# Patient Record
Sex: Male | Born: 2000 | Race: White | Hispanic: No | Marital: Single | Smoking: Never smoker
Health system: Southern US, Community
[De-identification: ages and names within clinical notes are randomized; demographics above are authoritative.]

## PROBLEM LIST (undated history)

## (undated) DIAGNOSIS — Z789 Other specified health status: Secondary | ICD-10-CM

## (undated) DIAGNOSIS — G43D0 Abdominal migraine, not intractable: Secondary | ICD-10-CM

## (undated) HISTORY — PX: WISDOM TOOTH EXTRACTION: SHX21

---
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
## 2019-03-12 ENCOUNTER — Telehealth: Payer: Self-pay | Admitting: Family Medicine

## 2019-03-12 DIAGNOSIS — F32A Depression, unspecified: Secondary | ICD-10-CM

## 2019-03-12 DIAGNOSIS — F329 Major depressive disorder, single episode, unspecified: Secondary | ICD-10-CM

## 2019-03-12 NOTE — Telephone Encounter (Signed)
Referral put in. Left message to return call  °

## 2019-03-12 NOTE — Telephone Encounter (Signed)
That is fine to go ahead with referral as requested certainly if any emergent symptoms going on with depression we recommend virtual visit or being seen [facility]

## 2019-03-12 NOTE — Telephone Encounter (Signed)
Mom Richard Raymond) is requesting a referral to Dr.Umrania at [facility] for depression. Patient is wanting to see a male doctor.Dr Jerold Coombe -[phone]

## 2019-03-23 NOTE — Telephone Encounter (Signed)
Left message to return call 

## 2019-03-25 ENCOUNTER — Ambulatory Visit (INDEPENDENT_AMBULATORY_CARE_PROVIDER_SITE_OTHER): Payer: 59 | Admitting: Child and Adolescent Psychiatry

## 2019-03-25 ENCOUNTER — Encounter: Payer: Self-pay | Admitting: Child and Adolescent Psychiatry

## 2019-03-25 ENCOUNTER — Other Ambulatory Visit: Payer: Self-pay

## 2019-03-25 DIAGNOSIS — F418 Other specified anxiety disorders: Secondary | ICD-10-CM | POA: Diagnosis not present

## 2019-03-25 DIAGNOSIS — F422 Mixed obsessional thoughts and acts: Secondary | ICD-10-CM

## 2019-03-25 DIAGNOSIS — F332 Major depressive disorder, recurrent severe without psychotic features: Secondary | ICD-10-CM

## 2019-03-25 MED ORDER — HYDROXYZINE HCL 25 MG PO TABS: 25.0000 mg | ORAL_TABLET | Freq: Every evening | ORAL | 0 refills | Status: DC | PRN

## 2019-03-25 MED ORDER — FLUOXETINE HCL 20 MG PO TABS: 20.0000 mg | ORAL_TABLET | Freq: Every day | ORAL | 0 refills | Status: DC

## 2019-03-25 NOTE — Progress Notes (Signed)
Virtual Visit via Telephone Note(attempted to do video visit, but because of technological difficulties at pt's end could not establish connection via video)  I connected with Richard Raymond on 03/25/19 at  3:00 PM EDT by telephone and verified that I am speaking with the correct person using two identifiers.  Location: Patient: mother's home Provider: office   I discussed the limitations, risks, security and privacy concerns of performing an evaluation and management service by telephone and the availability of in person appointments. I also discussed with the patient that there may be a patient responsible charge related to this service. The patient expressed understanding and agreed to proceed  Psychiatric Initial Child/Adolescent Assessment   Patient Identification: Richard Raymond MRN:  [id number] Date of Evaluation:  03/25/2019 Referral Source: Lilyan Punt (PCP) Chief Complaint:  "because I have been depressed..." Chief Complaint    Establish Care     Visit Diagnosis:    ICD-10-CM   1. Severe episode of recurrent major depressive disorder, without psychotic features (HCC) F33.2 FLUoxetine (PROZAC) 20 MG tablet    CBC With Differential    Comprehensive metabolic panel    TSH    Vitamin B12    VITAMIN D 25 Hydroxy (Vit-D Deficiency, Fractures)  2. Other specified anxiety disorders F41.8 FLUoxetine (PROZAC) 20 MG tablet  3. Mixed obsessional thoughts and acts F42.2 FLUoxetine (PROZAC) 20 MG tablet    History of Present Illness::   This is an 18 year old Caucasian male with medical history significant of abdominal migraine, and no formal psychiatric history referred by PCP for psychiatric evaluation and medication management for depression and anxiety.  He is homeschooled since early age and currently "in between 11th and 12th grade", his parents are separated since last 1 year and he spends 50-50 time with each parent, and he has 2 younger brothers.  Writer attempted to contact  evaluation over telemedicine video encounter however due to technological issues at the patient's end could connect via video therefore spoke with patient over the phone to complete evaluation.  Reports he is being referred because he is depressed.   Depression - Describes depression as "I have had low energy for a while... Lost interest in things that usually I have interest in... I can't maintain steady sleep schedule regardless of what I do... and I had issues getting my stuff done on time like school work or other things...". Onset - Since past few years since age 68-13. Mostly consistent and not episodic. Worsened progressively over years, feeling more and more tired. Denied any acute precipitant to it but reports worsened since the puberty. Reports depression is associated with anhedonia, sleep disturbances(can lay in bed for 6-7 hours without getting sleep, unable to stop overthinking about things mostly worrying thoughts), difficulties with concentration, appetite disturbance("sometimes I eat less and sometimes I eat more, I do stress eat sometimes, but not to the point of feeling uncomfortable or throw up afterwards except once five years ago"), poor energy(sometimes I don't want to get up at all). Reports chronic passive SI for the past few years, occurring about once every 2 weeks, and lasting maximum by 20 minutes and usually for about three minutes. Describes these thoughts as "why bother continue living..". Reports that he does not act on these thoughts because he doesn't want hurt people around him and still has goals he wants to achieve eventually. Goals - 1. Want draw a comic strip (story based ongoing comic script). 2. Really want to make music specifically in the fusion of  genres, Make music for living. Denies violence. Reports depression is always as there, denies any modifying factors.     Anxiety - Describes it as "I tend to worry about what others think of me, I constantly worry that I am  doing something wrong, I always worry that I don't measure up, tends to hamper my ability to do things along with low energy" Onset - "Pretty sure some extent the whole life". Reports that It has gotten worse over time. Reports constant anxiety. Panic attacks - infrequent, last in January can last for 2 hours, describes them as having racing heart and shaking.  Reports severe social anxiety for a long time". Reports that he often thinks of worse case scenarios that bring the anxiety.      OCD - OCD - "Constantly checking things, I keep going to pantry, looking for something, even there is nothing I need." Reports that he has intrusive thoughts which he describes as "weird thoughts that go against me and gross me out". Elaborates them as unwanted sexual, violence and sexual violence thoughts. Reports that he hoards things which could be useful later, says his room is pretty cluttered. Reports that "if I see things with multiple denomination it bothers me. you might see 2472 on the page or 2146 that would irk me constantly unless I stop looking at it..."  Denies AVH, did not admit delusions, denies manic or hypomanic symptoms, denies eating disorder symptoms, denies substance abuse.  Collateral information from mother - Mother reports that her main concern for Richard Raymond is his ability to function. He has been depressed, isolative. She reports that this started when he was around 2-31 years of age and progressively worsened over the time. His struggled academically and socially since then. She reports that at present he spends most of the time in his room and comes out only to eat something. She reported that she and her current husband started having problems with their relationship since he was 50-13 and that has adversely impacted him and his brothers over these years and appears to have precipitated depression, and he started having more academic problems since then. She reported that she did not have concerns  for ASD but one of the family member who works with kids with ASD and disabled child recently informed her about that she thinks Richard Raymond has characteristics similar to ASD. M shares that Richard Raymond reached all his developmental milestones on time, did not have struggle with socializing with others, but reported having sensitivity to light and difficulties with transitions. She reported that Richard Raymond has gained about 50-60 lbs over the past 2 years. She reported that she and her husband are separated and getting divorced, but Richard Raymond has been staying with his father most of the time because she does not have a bigger house and all of Richard Raymond's things are at dad's house.   Associated Signs/Symptoms: Depression Symptoms:  depressed mood, anhedonia, insomnia, psychomotor agitation, fatigue, feelings of worthlessness/guilt, difficulty concentrating, anxiety, panic attacks, decreased appetite, (Hypo) Manic Symptoms:  Denies Anxiety Symptoms:  Excessive Worry, Panic Symptoms, Obsessive Compulsive Symptoms:   Checking,, Social Anxiety, Psychotic Symptoms:  Did not admit and not elicited during the interview PTSD Symptoms: NA  Past Psychiatric History:   Inpatient: None RTC: None Outpatient: None    - Meds: None    - Therapy: None Denies previous suicide attempts or violence.    Previous Psychotropic Medications: No   Substance Abuse History in the last 12 months:  No.  Consequences  of Substance Abuse: NA  Past Medical History: Pt reports that he has abdominal migraine and also migraine headaches. He reports that he has severe abdominal pain and it is hard for him to keep food down during these episodes. His mother reported that pt and his father both has these severe abdominal migraines. Reports that his father had to be admitted few times for this.  Past Medical History:  Diagnosis Date  . Abdominal migraine     Past Surgical History:  Procedure Laterality Date  . WISDOM TOOTH  EXTRACTION      Family Psychiatric History: Parents and Grandparents with Depression and Anxiety  Family History:  Family History  Problem Relation Age of Onset  . ADD / ADHD Brother     Social History:   Social History   Socioeconomic History  . Marital status: Single    Spouse name: Not on file  . Number of children: 0  . Years of education: Not on file  . Highest education level: 12th grade  Occupational History  . Not on file  Social Needs  . Financial resource strain: Not hard at all  . Food insecurity:    Worry: Never true    Inability: Never true  . Transportation needs:    Medical: Yes    Non-medical: Yes  Tobacco Use  . Smoking status: Never Smoker  . Smokeless tobacco: Never Used  Substance and Sexual Activity  . Alcohol use: No  . Drug use: No  . Sexual activity: Not Currently  Lifestyle  . Physical activity:    Days per week: 0 days    Minutes per session: 0 min  . Stress: Very much  Relationships  . Social connections:    Talks on phone: Not on file    Gets together: Not on file    Attends religious service: Never    Active member of club or organization: Yes    Attends meetings of clubs or organizations: 1 to 4 times per year    Relationship status: Never married  Other Topics Concern  . Not on file  Social History Narrative  . Not on file    Additional Social History:   Living and custody situation: Parents are separated about a year ago going to get divorced, Reports that environment is "better now just by a bit" because prior to separation there was lot of yelling, etc. Shares time with both mother and father. However living with father most of the time for the past two weeks.    Relationships: Father - Decent; Mother - Decent; Siblings - younger, [id number] and [id number] - get along well with one but not other  Friends: Several friends, most of them are online. In person making friends are difficult, it is difficult to make fiend.   Sexual ID:  Bisexual; Gender ID up for debate - 4 months. Prefers He pronoun. Caused me some stress.  Guns:  No access to guns by himself.     Developmental History mentioned in HPI.   School History: Always homeschooled, currently in between 11th and 12th grade Legal History: None Hobbies/Interests: Likes to make music, play piano, draw, play video and virtual table top games.   Allergies:   Allergies  Allergen Reactions  . Cefzil [Cefprozil]     Diarrhea    Metabolic Disorder Labs: No results found for: HGBA1C, MPG No results found for: PROLACTIN No results found for: CHOL, TRIG, HDL, CHOLHDL, VLDL, LDLCALC No results found for: TSH  Therapeutic Level  Labs: No results found for: LITHIUM No results found for: CBMZ No results found for: VALPROATE  Current Medications: Current Outpatient Medications  Medication Sig Dispense Refill  . FLUoxetine (PROZAC) 20 MG tablet Take 1 tablet (20 mg total) by mouth daily. 30 tablet 0   No current facility-administered medications for this visit.     Musculoskeletal: Strength & Muscle Tone: unable to assess since visit was over the telemedicine. Gait & Station: unable to assess since visit was over the telemedicine. Patient leans: N/A  Psychiatric Specialty Exam: ROSReview of 12 systems negative except as mentioned in HPI   There were no vitals taken for this visit.There is no height or weight on file to calculate BMI.  General Appearance: unable to assess since it was a telephone encounter  Eye Contact:  unable to assess since it was a telephone encounter  Speech:  Clear and Coherent, Slow and Monotonous  Volume:  Normal  Mood:  Depressed  Affect:  unable to assess since it was a telephone encounter  Thought Process:  Goal Directed and Linear  Orientation:  Full (Time, Place, and Person)  Thought Content:  Concrete, No delusions or AVH  Suicidal Thoughts:  No  Homicidal Thoughts:  No  Memory:  Immediate;   Fair Recent;   Fair Remote;    Fair  Judgement:  Fair  Insight:  Fair  Psychomotor Activity:  unable to assess since it was a telephone encounter  Concentration: Concentration: Fair and Attention Span: Fair  Recall:  [employer] of Knowledge: Fair  Language: Fair  Akathisia:  NA  Handed:  NA  AIMS (if indicated):  not done  Assets:  Manufacturing systems engineer Desire for Improvement Financial Resources/Insurance Housing Leisure Time Physical Health Social Support Transportation Vocational/Educational  ADL's:  Intact  Cognition: WNL  Sleep:  Poor   Screenings:   Assessment and Plan:   # Depression (worse) - His reports are most consistent with Major depressive disorder. He is also biologically predisposed.  - Start Prozac 20 mg daily - Side effects including but not limited to nausea, vomiting, diarrhea, constipation, headaches, dizziness, black box warning of suicidal thoughts with SSRI were discussed with pt. Pt provided informed consent.  - Referred for ind therapy at [facility].  - Ordered CBC, CMP, TSH, Vit D and B12 levels.   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  - Same as mentioned above.   # OCD (worse) - His reports are most consistent with OCD.  - Same as mentioned above.   # Asperger's syndrome (chronic) - Mother's report of some sensory issues, difficulties with transition when he was young, concreteness, concerning for Asperger's syndrome. Will continue to evaluate.  Collateral information were obtained from mother Treatment plan was discussed with his mother with pt's consent.   Follow Up Instructions:    I discussed the assessment and treatment plan with the patient. The patient was provided an opportunity to ask questions and all were answered. The patient agreed with the plan and demonstrated an understanding of the instructions.   The patient was advised to call back or seek an in-person evaluation if the  symptoms worsen or if the condition fails to improve as anticipated.  I provided 60 minutes of non-face-to-face time during this encounter.    [name], MD 4/30/20205:53 PM

## 2019-03-25 NOTE — Progress Notes (Signed)
Tc on  03-25-19 @ 2:07 pt medical and surgical hx was reviewed and updated. Pt mediations and pharmacy was reviewed and updated. Allergies reviewed with no changes. No vitals taken as this was a phone consult.

## 2019-03-25 NOTE — Patient Instructions (Signed)
-   Please start Prozac (Fluoxetine) 20 mg daily - Please Complete the blood work. Blood work requisition order is accompanied with this.

## 2019-03-25 NOTE — Progress Notes (Signed)
Richard Raymond is a 18 y.o. male in treatment for Anxiety, Depression and OCD and displays the following risk factors for Suicide:  Demographic factors:  Male, Adolescent or young adult and Caucasian Current Mental Status: Deneis SI/HI Loss Factors: Decrease in vocational status Historical Factors: Family history of mental illness or substance abuse Risk Reduction Factors: Living with another person, especially a relative and Positive social support  CLINICAL FACTORS:  Severe Anxiety and/or Agitation Depression:   Anhedonia Obsessive-Compulsive Disorder More than one psychiatric diagnosis  COGNITIVE FEATURES THAT CONTRIBUTE TO RISK: Polarized thinking Thought constriction (tunnel vision)    SUICIDE RISK:  Richard Raymond currently denies any SI/HI and does not appear in imminent danger to self/others. His hx of depression, anxiety, OCD,  Intermittent chronic passive suicidal thoughts appears to put him at a chronically elevated risk of self harm. He appears future oriented, has long term goals for himself, appear to have good social support, appear to have financial stability and these all will likely serve as protective factors for him. He and parent are recommended to follow up with outpatient providers for medications, and therapy which would likely help reduce chronic risk.   Mental Status: As mentioned in H&P from today's visit.    PLAN OF CARE: As mentioned in H&P from today's visit.    [name], MD 03/25/2019, 6:46 PM

## 2019-03-26 ENCOUNTER — Encounter: Payer: Self-pay | Admitting: Child and Adolescent Psychiatry

## 2019-03-29 ENCOUNTER — Telehealth: Payer: Self-pay | Admitting: Family Medicine

## 2019-03-29 NOTE — Telephone Encounter (Signed)
Patient was seen psychiatry in [place]  They were concerned about the possibility of porphyria  Patient has recurring abdominal pains They are requesting referral to specialist Under the referral please put concern for possibility of porphyria  I recommend [facility] Dr. Leighton Parody Thanks

## 2019-03-29 NOTE — Telephone Encounter (Signed)
Referral put in.

## 2019-03-29 NOTE — Addendum Note (Signed)
Addended by: Metro Kung on: 03/29/2019 01:37 PM   Modules accepted: Orders

## 2019-03-30 ENCOUNTER — Telehealth: Payer: Self-pay | Admitting: Child and Adolescent Psychiatry

## 2019-03-30 NOTE — Telephone Encounter (Signed)
I spoke with Richard Raymond and his mother separately today. Spoke with Mother after Richard Raymond's permission. I discussed with them about the concerns for acute intermittent porphyria and the reason behind it(both Richard Raymond and his father has intermittent abdominal pain with diarrhea and constipation and Richard Raymond has significant psychiatric issues). Discussed with them AIP is rare and presents with vague neurovisceral signs and symptom and requires high degree of suspicion. Discussed importance to rule this out. Both Richard Raymond and Mother verbalized understanding. Writer spoke with Dr. Gerda Diss yesterday expressing the concerns and he has made a referral to specialist. Writer informed this to [facility] and mother.

## 2019-04-08 ENCOUNTER — Other Ambulatory Visit: Payer: Self-pay

## 2019-04-08 ENCOUNTER — Encounter: Payer: Self-pay | Admitting: Child and Adolescent Psychiatry

## 2019-04-08 ENCOUNTER — Ambulatory Visit (INDEPENDENT_AMBULATORY_CARE_PROVIDER_SITE_OTHER): Payer: 59 | Admitting: Child and Adolescent Psychiatry

## 2019-04-08 DIAGNOSIS — F332 Major depressive disorder, recurrent severe without psychotic features: Secondary | ICD-10-CM

## 2019-04-08 DIAGNOSIS — F418 Other specified anxiety disorders: Secondary | ICD-10-CM | POA: Diagnosis not present

## 2019-04-08 DIAGNOSIS — F422 Mixed obsessional thoughts and acts: Secondary | ICD-10-CM | POA: Diagnosis not present

## 2019-04-08 MED ORDER — FLUOXETINE HCL 20 MG PO TABS: 20.0000 mg | ORAL_TABLET | Freq: Every day | ORAL | 0 refills | Status: DC

## 2019-04-08 MED ORDER — FLUOXETINE HCL 10 MG PO TABS: 10.0000 mg | ORAL_TABLET | Freq: Every day | ORAL | 0 refills | Status: DC

## 2019-04-08 NOTE — Progress Notes (Signed)
Virtual Visit via Video Note  I connected with Richard Raymond on 04/08/19 at  3:00 PM EDT by a video enabled telemedicine application and verified that I am speaking with the correct person using two identifiers.  Location: Patient: Home Provider: Office   I discussed the limitations of evaluation and management by telemedicine and the availability of in person appointments. The patient expressed understanding and agreed to proceed.   BH MD/PA/NP OP Progress Note  04/08/2019 3:36 PM Richard Raymond  MRN:  [id number]  Chief Complaint: Med management follow up for Depression, anxiety and OCD.   HPI: 18 yo with hx of depression, anxiety and OCD who was seen for initial evaluation about 2 weeks ago and was started on Prozac 20 mg once a day presents today for medication management follow-up for depression, anxiety and OCD.  He reports that since he started on Prozac he feels he has more energy and motivation, feeling less depressed and has been sleeping slightly better.  He reports that his sleep continues to be out of routine but instead of going to bed at 4 oh 5 in the morning he is able to go to bed at 2:00 in the morning.  He reports that his anxiety is slightly better but not as much as compared to his mood and energy.  He however reports that his anxiety is mostly in the context of social situation and he has been staying at home mostly.  He denies any change in his obsessive thoughts.  He reports that about 1-1/2 weeks ago he had passive suicidal thoughts because he felt in his game "dragons and dungeons".  Denies any other suicidal thoughts or self-harm behaviors.  He reports that he has noted some mild easiness into his stomach when he started Prozac which is getting better overall.  Denies any other concerns for today's visit. Visit Diagnosis:    ICD-10-CM   1. Severe episode of recurrent major depressive disorder, without psychotic features (HCC) F33.2 FLUoxetine (PROZAC) 20 MG tablet  2. Other  specified anxiety disorders F41.8 FLUoxetine (PROZAC) 20 MG tablet    FLUoxetine (PROZAC) 10 MG tablet  3. Mixed obsessional thoughts and acts F42.2 FLUoxetine (PROZAC) 20 MG tablet    Past Psychiatric History: As mentioned in initial H&P, reviewed today, no change  Past Medical History:  Past Medical History:  Diagnosis Date  . Abdominal migraine     Past Surgical History:  Procedure Laterality Date  . WISDOM TOOTH EXTRACTION      Family Psychiatric History: As mentioned in initial H&P, reviewed today, no change  Family History:  Family History  Problem Relation Age of Onset  . ADD / ADHD Brother     Social History:  Social History   Socioeconomic History  . Marital status: Single    Spouse name: Not on file  . Number of children: 0  . Years of education: Not on file  . Highest education level: 12th grade  Occupational History  . Not on file  Social Needs  . Financial resource strain: Not hard at all  . Food insecurity:    Worry: Never true    Inability: Never true  . Transportation needs:    Medical: Yes    Non-medical: Yes  Tobacco Use  . Smoking status: Never Smoker  . Smokeless tobacco: Never Used  Substance and Sexual Activity  . Alcohol use: No  . Drug use: No  . Sexual activity: Not Currently  Lifestyle  . Physical activity:  Days per week: 0 days    Minutes per session: 0 min  . Stress: Very much  Relationships  . Social connections:    Talks on phone: Not on file    Gets together: Not on file    Attends religious service: Never    Active member of club or organization: Yes    Attends meetings of clubs or organizations: 1 to 4 times per year    Relationship status: Never married  Other Topics Concern  . Not on file  Social History Narrative  . Not on file    Allergies:  Allergies  Allergen Reactions  . Cefzil [Cefprozil]     Diarrhea    Metabolic Disorder Labs: No results found for: HGBA1C, MPG No results found for:  PROLACTIN No results found for: CHOL, TRIG, HDL, CHOLHDL, VLDL, LDLCALC No results found for: TSH  Therapeutic Level Labs: No results found for: LITHIUM No results found for: VALPROATE No components found for:  CBMZ  Current Medications: Current Outpatient Medications  Medication Sig Dispense Refill  . FLUoxetine (PROZAC) 10 MG tablet Take 1 tablet (10 mg total) by mouth daily. To be combined with Prozac 20 mg daily. 30 tablet 0  . FLUoxetine (PROZAC) 20 MG tablet Take 1 tablet (20 mg total) by mouth daily. 30 tablet 0  . hydrOXYzine (ATARAX/VISTARIL) 25 MG tablet Take 1 tablet (25 mg total) by mouth at bedtime as needed (Sleep). 30 tablet 0   No current facility-administered medications for this visit.      Musculoskeletal: Strength & Muscle Tone: unable to assess since visit was over the telemedicine. Gait & Station: unable to assess since visit was over the telemedicine. Patient leans: N/A  Psychiatric Specialty Exam: ROS  There were no vitals taken for this visit.There is no height or weight on file to calculate BMI.  General Appearance: Casual and Fairly Groomed  Eye Contact:  Good  Speech:  Slow  Volume:  Normal  Mood:  "better"  Affect:  Appropriate and Constricted  Thought Process:  Linear and Descriptions of Associations: Intact evidence of thought blocking  Orientation:  Full (Time, Place, and Person)  Thought Content: Logical   Suicidal Thoughts:  No  Homicidal Thoughts:  No  Memory:  Immediate;   Fair Recent;   Fair Remote;   Fair  Judgement:  Fair  Insight:  Fair  Psychomotor Activity:  Normal  Concentration:  Concentration: Fair and Attention Span: Fair  Recall:  [employer] of Knowledge: Fair  Language: Fair  Akathisia:  NA    AIMS (if indicated): not done  Assets:  Communication Skills Desire for Improvement Housing Leisure Time Physical Health Social Support Transportation Vocational/Educational  ADL's:  Intact  Cognition: WNL  Sleep:   Good   Screenings:   Assessment and Plan:   # Depression (improving) - His reports are most consistent with Major depressive disorder. He is also biologically predisposed.  - Increase Prozac to 30 mg daily - Side effects including but not limited to nausea, vomiting, diarrhea, constipation, headaches, dizziness, black box warning of suicidal thoughts with SSRI were discussed with pt. Pt provided informed consent.  - Therapy scheduled with Ms. Peacock next week.  - Ordered CBC, CMP, TSH, Vit D and B12 levels. Pending.   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  - Same as mentioned above.   # OCD (worse) - His reports are most consistent with OCD.  -  Same as mentioned above.   # Asperger's syndrome (chronic) - Mother's report of some sensory issues, difficulties with transition when he was young, concreteness, concerning for Asperger's syndrome.   # Acute Intermittent porphyria(AIP) - Concerns for AIP. He has hx of abdominal migraine which he describes as intermittent episodes of uneasiness in stomach and moderate to severe abdominal pain accompanied with mild numbness in his body, bloating. His father has similar hx and perhaps worse attacks than him. Spoke with porphyria foundation representative on their physician consultation line and they recommended free genetic testing. Conveying this to PCP. Also spoke with PCP about the concern previous and he has referred pt to GI. Discussed this with pt and father with pt's permission.   Mother phone  - [phone]  30 minutes with pt > 50% time spent on counseling and coordination of care.     Follow Up Instructions:    I discussed the assessment and treatment plan with the patient. The patient was provided an opportunity to ask questions and all were answered. The patient agreed with the plan and demonstrated an understanding of the  instructions.   The patient was advised to call back or seek an in-person evaluation if the symptoms worsen or if the condition fails to improve as anticipated.  I provided 30 minutes of non-face-to-face time during this encounter.   [name], MD  [name], MD 04/08/2019, 3:36 PM

## 2019-04-15 ENCOUNTER — Ambulatory Visit (INDEPENDENT_AMBULATORY_CARE_PROVIDER_SITE_OTHER): Payer: 59 | Admitting: Licensed Clinical Social Worker

## 2019-04-15 ENCOUNTER — Other Ambulatory Visit: Payer: Self-pay

## 2019-04-15 DIAGNOSIS — F418 Other specified anxiety disorders: Secondary | ICD-10-CM | POA: Diagnosis not present

## 2019-04-15 DIAGNOSIS — F332 Major depressive disorder, recurrent severe without psychotic features: Secondary | ICD-10-CM

## 2019-04-18 ENCOUNTER — Other Ambulatory Visit: Payer: Self-pay | Admitting: Child and Adolescent Psychiatry

## 2019-04-18 DIAGNOSIS — F418 Other specified anxiety disorders: Secondary | ICD-10-CM

## 2019-04-21 NOTE — Progress Notes (Signed)
[facility] Initial Clinical Assessment  MRN: [id number] NAME: Wylan Himebaugh Date: 04/15/19  Total time: 1 hour  Type of Contact:  virtual Patient consent obtained:  yes Reason for Visit today:  establish care  Treatment History Patient recently received Inpatient Treatment:  denies  Facility/Program:    Date of discharge:   Patient currently being seen by therapist/psychiatrist:  Dr. Jerold Coombe Patient currently receiving the following services:    Past Psychiatric History/Hospitalization(s): Anxiety: Yes Bipolar Disorder: No Depression: Yes Mania: No Psychosis: No Schizophrenia: No Personality Disorder: No Hospitalization for psychiatric illness: No History of Electroconvulsive Shock Therapy: No Prior Suicide Attempts: No  Clinical Assessment:  PHQ-9 Assessments: No flowsheet data found.  GAD-7 Assessments: No flowsheet data found.   Social Functioning Social maturity:   Social judgement:    Stress Current stressors:   Familial stressors:   Sleep:   Appetite:   Coping ability:   Patient taking medications as prescribed:    Current medications:  Outpatient Encounter Medications as of 04/15/2019  Medication Sig  . FLUoxetine (PROZAC) 10 MG tablet Take 1 tablet (10 mg total) by mouth daily. To be combined with Prozac 20 mg daily.  Marland Kitchen FLUoxetine (PROZAC) 20 MG tablet Take 1 tablet (20 mg total) by mouth daily.  . [DISCONTINUED] hydrOXYzine (ATARAX/VISTARIL) 25 MG tablet Take 1 tablet (25 mg total) by mouth at bedtime as needed (Sleep).   No facility-administered encounter medications on file as of 04/15/2019.     Self-harm Behaviors Risk Assessment Self-harm risk factors:   Patient endorses recent thoughts of harming self:    [place] Suicide Severity Rating Scale: No flowsheet data found.  Danger to Others Risk Assessment Danger to others risk factors:   Patient endorses recent thoughts of harming others:    Dynamic Appraisal of Situational  Aggression (DASA): No flowsheet data found.  Substance Use Assessment Patient recently consumed alcohol:    Alcohol Use Disorder Identification Test (AUDIT): No flowsheet data found. Patient recently used drugs:    Opioid Risk Assessment:  Patient is concerned about dependence or abuse of substances:    ASAM Multidimensional Assessment Summary:  Dimension 1:    Dimension 1 Rating:    Dimension 2:    Dimension 2 Rating:    Dimension 3:    Dimension 3 Rating:    Dimension 4:    Dimension 4 Rating:    Dimension 5:    Dimension 5 Rating:    Dimension 6:    Dimension 6 Rating:   ASAM's Severity Rating Score:   ASAM Recommended Level of Treatment:     Goals, Interventions and Follow-up Plan Goals: Increase healthy adjustment to current life circumstances Interventions: Motivational Interviewing, Behavioral Activation and Brief CBT Follow-up Plan: Refer to [facility] Outpatient Therapy  Summary of Clinical Assessment Summary: Jb is a 18 year old, single male.  He lives with his father in [place], [place]. He expresses depressive symptoms (sadness, lack of motivation, increased appetite, fatigue, poor relationships with peers) on a daily basis for the past few years. He has a good relationship with his father.  He reprots that his strength is his ability to focus and he acknowledges a weakness of social interaction.  He has been home schooled since [place] and is not working.  He reports only having friends online.  He enjoys gaming, playing the piano, drawing and being apart of an online dungeons and dragons group. Rollie reports a family history of depression.     Marinda Elk, LCSW

## 2019-04-23 ENCOUNTER — Telehealth: Payer: Self-pay | Admitting: Family Medicine

## 2019-04-23 NOTE — Telephone Encounter (Signed)
Please advise - Dr. Joellyn Rued is at [facility] we refer to him specifically or should we refer to [facility]?

## 2019-04-26 NOTE — Telephone Encounter (Signed)
This specialist was recommended by his psychiatrist because this doctor specializes with porphyria If the family wants to go to [facility] it would be fine to refer them in that direction their choice

## 2019-05-04 ENCOUNTER — Encounter: Payer: Self-pay | Admitting: Family Medicine

## 2019-05-05 ENCOUNTER — Other Ambulatory Visit: Payer: Self-pay | Admitting: Child and Adolescent Psychiatry

## 2019-05-05 DIAGNOSIS — F418 Other specified anxiety disorders: Secondary | ICD-10-CM

## 2019-05-05 NOTE — Telephone Encounter (Signed)
Faxed referral to [facility] 05/04/2019 & mailed letter to pt explaining that referral had been sent

## 2019-05-06 ENCOUNTER — Ambulatory Visit (INDEPENDENT_AMBULATORY_CARE_PROVIDER_SITE_OTHER): Payer: 59 | Admitting: Child and Adolescent Psychiatry

## 2019-05-06 ENCOUNTER — Encounter: Payer: Self-pay | Admitting: Child and Adolescent Psychiatry

## 2019-05-06 ENCOUNTER — Other Ambulatory Visit: Payer: Self-pay

## 2019-05-06 DIAGNOSIS — F331 Major depressive disorder, recurrent, moderate: Secondary | ICD-10-CM | POA: Diagnosis not present

## 2019-05-06 DIAGNOSIS — F422 Mixed obsessional thoughts and acts: Secondary | ICD-10-CM

## 2019-05-06 DIAGNOSIS — F418 Other specified anxiety disorders: Secondary | ICD-10-CM | POA: Insufficient documentation

## 2019-05-06 MED ORDER — FLUOXETINE HCL 20 MG PO TABS: 20.0000 mg | ORAL_TABLET | Freq: Every day | ORAL | 0 refills | Status: DC

## 2019-05-06 NOTE — Progress Notes (Signed)
Virtual Visit via Video Note  I connected with [name] on 05/06/19 at  2:00 PM EDT by a video enabled telemedicine application and verified that I am speaking with the correct person using two identifiers.  Location: Patient: Home Provider: Office   I discussed the limitations of evaluation and management by telemedicine and the availability of in person appointments. The patient expressed understanding and agreed to proceed.   BH MD/PA/NP OP Progress Note  05/06/2019 2:48 PM Richard Raymond  MRN:  [id number]  Chief Complaint: Medication management follow-up for depression, anxiety and OCD. HPI: This is an 18 year old who is Caucasian male with history of depression, anxiety and OCD was seen and evaluated over telemedicine encounter for routine medication management follow-up visit.  He reports that since last visit he has noted improvement in his mood and anxiety symptoms.  He reports that he has been feeling overall better.  He reports that being able to get into the positive habits such as starting to exercise, communicating with others about the signs of him feeling better.  He rates his depression at 1-3/10(10 = most depressed) and anxiety at 2-4(10 = most anxious). He reports that he has been able to he gets panic attacks about every other week but they last about 30-60 minutes as compare to hours previously. He denies thoughts of suicide or self harm, reports eating well, improvement in anhedonia, and obsessive thoughts and compulsive behaviors. He reports having intrussive thoughts of violence but no sexual thoughts. He reported that he has noted he was sleeping too much and feeling dizzy through out the day with atarax 25 so he stopped taking it so his sleep is still a problem. He reported that he wanted to talk about something privately and therefore video visit was switched to phone visit. He reported that he has thought about his gender identity recently and now believes he always his  gender. He reports strong dislike of his male appearance and male sexual organs. We discussed to speak more on this given limited time for this appointment and rescheduled a follow up a next week. He verbalized understanding.   Visit Diagnosis:    ICD-10-CM   1. Other specified anxiety disorders  F41.8 FLUoxetine (PROZAC) 20 MG tablet  2. Moderate episode of recurrent major depressive disorder (HCC)  F33.1 FLUoxetine (PROZAC) 20 MG tablet  3. Mixed obsessional thoughts and acts  F42.2 FLUoxetine (PROZAC) 20 MG tablet    Past Psychiatric History: As mentioned in initial H&P, reviewed today, no change  Past Medical History:  Past Medical History:  Diagnosis Date  . Abdominal migraine     Past Surgical History:  Procedure Laterality Date  . WISDOM TOOTH EXTRACTION      Family Psychiatric History: As mentioned in initial H&P, reviewed today, no change  Family History:  Family History  Problem Relation Age of Onset  . ADD / ADHD Brother     Social History:  Social History   Socioeconomic History  . Marital status: Single    Spouse name: Not on file  . Number of children: 0  . Years of education: Not on file  . Highest education level: 12th grade  Occupational History  . Not on file  Social Needs  . Financial resource strain: Not hard at all  . Food insecurity    Worry: Never true    Inability: Never true  . Transportation needs    Medical: Yes    Non-medical: Yes  Tobacco Use  . Smoking  status: Never Smoker  . Smokeless tobacco: Never Used  Substance and Sexual Activity  . Alcohol use: No  . Drug use: No  . Sexual activity: Not Currently  Lifestyle  . Physical activity    Days per week: 0 days    Minutes per session: 0 min  . Stress: Very much  Relationships  . Social Musicianconnections    Talks on phone: Not on file    Gets together: Not on file    Attends religious service: Never    Active member of club or organization: Yes    Attends meetings of clubs or  organizations: 1 to 4 times per year    Relationship status: Never married  Other Topics Concern  . Not on file  Social History Narrative  . Not on file    Allergies:  Allergies  Allergen Reactions  . Cefzil [Cefprozil]     Diarrhea    Metabolic Disorder Labs: No results found for: HGBA1C, MPG No results found for: PROLACTIN No results found for: CHOL, TRIG, HDL, CHOLHDL, VLDL, LDLCALC No results found for: TSH  Therapeutic Level Labs: No results found for: LITHIUM No results found for: VALPROATE No components found for:  CBMZ  Current Medications: Current Outpatient Medications  Medication Sig Dispense Refill  . FLUoxetine (PROZAC) 10 MG tablet TAKE 1 TABLET BY MOUTH DAILY TO BE COMBINED WITH PROZAC 20 MG DAILY. 30 tablet 0  . FLUoxetine (PROZAC) 20 MG tablet Take 1 tablet (20 mg total) by mouth daily. 30 tablet 0  . hydrOXYzine (ATARAX/VISTARIL) 25 MG tablet TAKE 1 TABLET BY MOUTH AT BEDTIME AS NEEDED FOR SLEEP 30 tablet 0   No current facility-administered medications for this visit.      Musculoskeletal: Strength & Muscle Tone: unable to assess since visit was over the telemedicine. Gait & Station: unable to assess since visit was over the telemedicine. Patient leans: N/A  Psychiatric Specialty Exam: ROS  There were no vitals taken for this visit.There is no height or weight on file to calculate BMI.  General Appearance: Casual and Fairly Groomed  Eye Contact:  Good  Speech:  Slow  Volume:  Normal  Mood:  "better"  Affect:  Appropriate and Constricted  Thought Process:  Linear and Descriptions of Associations: Intact evidence of thought blocking  Orientation:  Full (Time, Place, and Person)  Thought Content: Logical   Suicidal Thoughts:  No  Homicidal Thoughts:  No  Memory:  Immediate;   Fair Recent;   Fair Remote;   Fair  Judgement:  Fair  Insight:  Fair  Psychomotor Activity:  Normal  Concentration:  Concentration: Fair and Attention Span: Fair   Recall:  [employer]  Fund of Knowledge: Fair  Language: Fair  Akathisia:  NA    AIMS (if indicated): not done  Assets:  Communication Skills Desire for Improvement Housing Leisure Time Physical Health Social Support Transportation Vocational/Educational  ADL's:  Intact  Cognition: WNL  Sleep:  Good   Screenings:   Assessment and Plan:   # Depression (improving) - Continue Prozac 30 mg daily - Side effects including but not limited to nausea, vomiting, diarrhea, constipation, headaches, dizziness, black box warning of suicidal thoughts with SSRI were discussed with pt. Pt provided informed consent.  - Therapy scheduled with Ms. Peacock next week.  - Ordered CBC, CMP, TSH, Vit D and B12 levels. Pending.   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  -  Same as mentioned above.   # OCD (worse) - His reports are most consistent with OCD.  - Same as mentioned above.   # Asperger's syndrome (chronic) - Mother's report of some sensory issues, difficulties with transition when he was young, concreteness, concerning for Asperger's syndrome.   # Acute Intermittent porphyria(AIP) - Concerns for AIP. He has hx of abdominal migraine which he describes as intermittent episodes of uneasiness in stomach and moderate to severe abdominal pain accompanied with mild numbness in his body, bloating. His father has similar hx and perhaps worse attacks than him. Spoke with porphyria foundation representative previously on their physician consultation line and they recommended free genetic testing. Will follow up with PCP on this. Also spoke with PCP about the concern previous and he is referred to [facility].  # Gender Identity Concerns - Will continue to assess and recommend appropriate intervention.   Mother phone  - [phone]  30 minutes with pt > 50% time spent on counseling and coordination of care.      Follow Up Instructions:    I discussed the assessment and treatment plan with the patient. The patient was provided an opportunity to ask questions and all were answered. The patient agreed with the plan and demonstrated an understanding of the instructions.   The patient was advised to call back or seek an in-person evaluation if the symptoms worsen or if the condition fails to improve as anticipated.  I provided 30 minutes of non-face-to-face time during this encounter.   Darcel SmallingHiren M Lenka Zhao, MD  Darcel SmallingHiren M Folashade Gamboa, MD 05/06/2019, 2:48 PM

## 2019-05-13 ENCOUNTER — Encounter: Payer: Self-pay | Admitting: Child and Adolescent Psychiatry

## 2019-05-13 ENCOUNTER — Other Ambulatory Visit: Payer: Self-pay

## 2019-05-13 ENCOUNTER — Ambulatory Visit (INDEPENDENT_AMBULATORY_CARE_PROVIDER_SITE_OTHER): Payer: 59 | Admitting: Child and Adolescent Psychiatry

## 2019-05-13 DIAGNOSIS — F330 Major depressive disorder, recurrent, mild: Secondary | ICD-10-CM

## 2019-05-13 DIAGNOSIS — F418 Other specified anxiety disorders: Secondary | ICD-10-CM

## 2019-05-13 DIAGNOSIS — F422 Mixed obsessional thoughts and acts: Secondary | ICD-10-CM

## 2019-05-13 MED ORDER — FLUOXETINE HCL 10 MG PO TABS: ORAL_TABLET | ORAL | 0 refills | Status: DC

## 2019-05-13 MED ORDER — HYDROXYZINE HCL 25 MG PO TABS: 25.0000 mg | ORAL_TABLET | Freq: Every evening | ORAL | 0 refills | Status: DC | PRN

## 2019-05-13 MED ORDER — FLUOXETINE HCL 20 MG PO TABS: 20.0000 mg | ORAL_TABLET | Freq: Every day | ORAL | 0 refills | Status: DC

## 2019-05-13 NOTE — Progress Notes (Signed)
Virtual Visit via Video Note  I connected with Richard Raymond on 05/13/19 at  2:30 PM EDT by a video enabled telemedicine application and verified that I am speaking with the correct person using two identifiers.  Location: Patient: Home Provider: Office   I discussed the limitations of evaluation and management by telemedicine and the availability of in person appointments. The patient expressed understanding and agreed to proceed.   BH MD/PA/NP OP Progress Note  05/13/2019 5:19 PM Richard Raymond  MRN:  [id number]  Chief Complaint: Medication management follow-up for depression, anxiety and OCD.     HPI: This is an 18 year old Caucasian male, currently identifying transgeder male, preferring she/her pronoun at this time, with history of depression, anxiety and OCD was seen and evaluated over telemedicine encounter to discuss his concerns regarding her gender identity.  Patient was seen and evaluated for medication management visit last week during which she expressed concerns about her gender identity and therefore was rescheduled to see this Clinical research associatewriter today.  In the interim since last visit she reports that her mood has stayed mostly stable however because of headaches since the last 1 to 2 days she has been feeling little more down as compared to before, reports that her anxiety is stable, reports that she continues to sleep well and her energy level is good.  She denies thoughts of suicide or self-harm.  In regards of gender identity she reprots that she has had concerns for gender identity for sometimes, reports that since puberty she did not like her male sexual organ, over the past few years does not like her male body appearance, does not like when her grandparents or parents say her that she is "handsome.." and therfore would pick her skin on the face and arms, reports that he does not like using the pronoun to describe her male. She reports that looking herself in the mirror makes her  "cringe". She reports that she is not sure if her gender identity concern has contributed to her depression. We discussed her preference of how to move forward. She reports that for now she would like use she/her pronoun, does not want to disclose to anyone about her gender identity right now and we discussed that Clinical research associatewriter can facilitate the conversation with her parents if she would like. She verbalized understanding. She reports that she would prefer to be called Richard Raymond at this time.    Visit Diagnosis:    ICD-10-CM   1. Mild episode of recurrent major depressive disorder (HCC)  F33.0 FLUoxetine (PROZAC) 20 MG tablet  2. Other specified anxiety disorders  F41.8 FLUoxetine (PROZAC) 20 MG tablet    FLUoxetine (PROZAC) 10 MG tablet    hydrOXYzine (ATARAX/VISTARIL) 25 MG tablet  3. Mixed obsessional thoughts and acts  F42.2 FLUoxetine (PROZAC) 20 MG tablet    Past Psychiatric History: As mentioned in initial H&P, reviewed today, no change   Past Medical History:  Past Medical History:  Diagnosis Date  . Abdominal migraine     Past Surgical History:  Procedure Laterality Date  . WISDOM TOOTH EXTRACTION      Family Psychiatric History: As mentioned in initial H&P, reviewed today, no change  Family History:  Family History  Problem Relation Age of Onset  . ADD / ADHD Brother     Social History:  Social History   Socioeconomic History  . Marital status: Single    Spouse name: Not on file  . Number of children: 0  . Years of education: Not  on file  . Highest education level: 12th grade  Occupational History  . Not on file  Social Needs  . Financial resource strain: Not hard at all  . Food insecurity    Worry: Never true    Inability: Never true  . Transportation needs    Medical: Yes    Non-medical: Yes  Tobacco Use  . Smoking status: Never Smoker  . Smokeless tobacco: Never Used  Substance and Sexual Activity  . Alcohol use: No  . Drug use: No  . Sexual activity: Not  Currently  Lifestyle  . Physical activity    Days per week: 0 days    Minutes per session: 0 min  . Stress: Very much  Relationships  . Social Musicianconnections    Talks on phone: Not on file    Gets together: Not on file    Attends religious service: Never    Active member of club or organization: Yes    Attends meetings of clubs or organizations: 1 to 4 times per year    Relationship status: Never married  Other Topics Concern  . Not on file  Social History Narrative  . Not on file    Allergies:  Allergies  Allergen Reactions  . Cefzil [Cefprozil]     Diarrhea    Metabolic Disorder Labs: No results found for: HGBA1C, MPG No results found for: PROLACTIN No results found for: CHOL, TRIG, HDL, CHOLHDL, VLDL, LDLCALC No results found for: TSH  Therapeutic Level Labs: No results found for: LITHIUM No results found for: VALPROATE No components found for:  CBMZ  Current Medications: Current Outpatient Medications  Medication Sig Dispense Refill  . FLUoxetine (PROZAC) 10 MG tablet TAKE 1 TABLET BY MOUTH DAILY TO BE COMBINED WITH PROZAC 20 MG DAILY. 30 tablet 0  . FLUoxetine (PROZAC) 20 MG tablet Take 1 tablet (20 mg total) by mouth daily. 30 tablet 0  . hydrOXYzine (ATARAX/VISTARIL) 25 MG tablet Take 1 tablet (25 mg total) by mouth at bedtime as needed. for sleep 30 tablet 0   No current facility-administered medications for this visit.      Musculoskeletal: Strength & Muscle Tone: unable to assess since visit was over the telemedicine. Gait & Station: unable to assess since visit was over the telemedicine. Patient leans: N/A  Psychiatric Specialty Exam: ROSReview of 12 systems negative except as mentioned in HPI  Appearance: unable to assess since virtual visit was over the telephone Attitude: calm, cooperative Activity: unable to assess since virtual visit was over the telephone Speech: normal rate, rhythm and volume Thought Process: Logical, linear, and  goal-directed.  Associations: no looseness, tangentiality, circumstantiality, flight of ideas, thought blocking or word salad noted Thought Content: (abnormal/psychotic thoughts): no abnormal or delusional thought process evidenced SI/HI: denies Si/Hi Perception: no illusions or visual/auditory hallucinations noted; Mood & Affect: "good"/unable to assess since virtual visit was over the telephone  Judgment & Insight: both fair Attention and Concentration : Good Cognition : WNL Language : Good ADL - Intact  Screenings:   Assessment and Plan:   # Depression (improving) - Continue Prozac 30 mg daily - Side effects including but not limited to nausea, vomiting, diarrhea, constipation, headaches, dizziness, black box warning of suicidal thoughts with SSRI were discussed with pt. Pt provided informed consent.  - Therapy scheduled with Ms. Peacock next week.  - Ordered CBC, CMP, TSH, Vit D and B12 levels. Pending.   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  - Same as mentioned above.   # OCD (worse) - His reports are most consistent with OCD.  - Same as mentioned above.   # Asperger's syndrome (chronic) - Mother's report of some sensory issues, difficulties with transition when he was young, concreteness, concerning for Asperger's syndrome.   # Acute Intermittent porphyria(AIP) - Concerns for AIP. He has hx of abdominal migraine which he describes as intermittent episodes of uneasiness in stomach and moderate to severe abdominal pain accompanied with mild numbness in his body, bloating. His father has similar hx and perhaps worse attacks than him. Spoke with porphyria foundation representative previously on their physician consultation line and they recommended free genetic testing. Will follow up with PCP on this. Also spoke with PCP about the concern previous and he is referred to [facility].  #  Gender Identity Concerns - at present would like to use she/her pronoun and name Richard Raymond  Mother phone  - [phone]  30 minutes with pt > 50% time spent on counseling and coordination of care as mentioned above regarding pt's gender identity concerns.      Follow Up Instructions:    I discussed the assessment and treatment plan with the patient. The patient was provided an opportunity to ask questions and all were answered. The patient agreed with the plan and demonstrated an understanding of the instructions.   The patient was advised to call back or seek an in-person evaluation if the symptoms worsen or if the condition fails to improve as anticipated.  I provided 30 minutes of non-face-to-face time during this encounter.   Orlene Erm, MD  Orlene Erm, MD 05/13/2019, 5:19 PM

## 2019-05-20 ENCOUNTER — Ambulatory Visit: Payer: 59 | Admitting: Licensed Clinical Social Worker

## 2019-05-20 ENCOUNTER — Other Ambulatory Visit: Payer: Self-pay

## 2019-05-23 IMAGING — DX DG ANKLE COMPLETE 3+V*R*
3 series · 3 of 3 positions shown · non-contrast
Comparison: Right foot radiographs obtained at the same time.

CLINICAL DATA: Lateral right ankle and foot pain after missing a
step going down stairs.

EXAM:
RIGHT ANKLE - COMPLETE 3+ VIEW

[ankle ap]
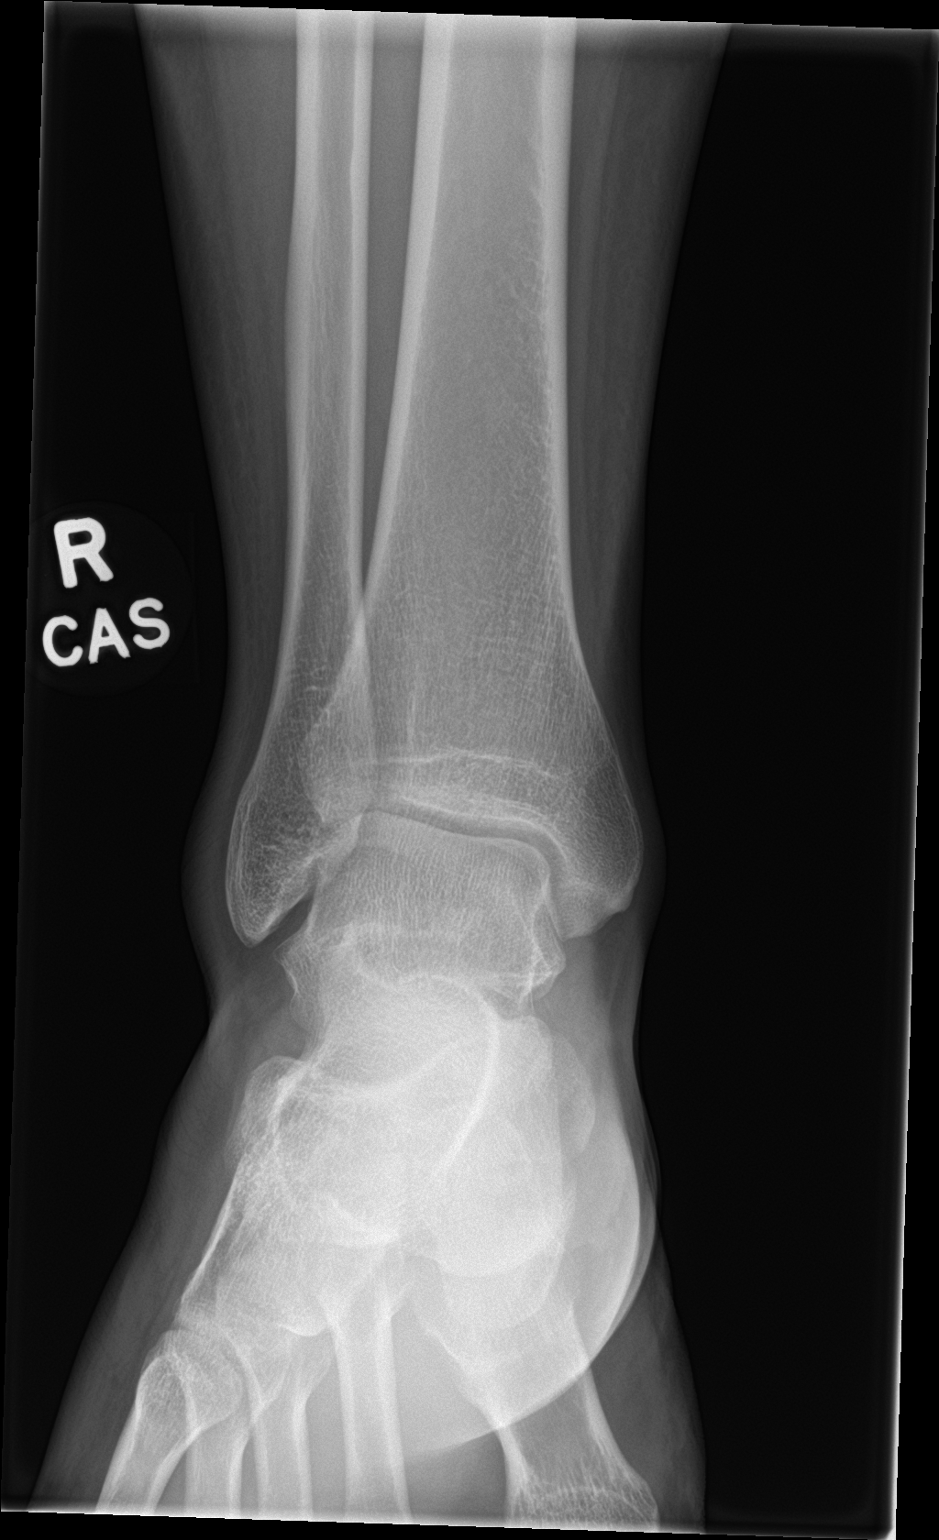

[ankle obl]
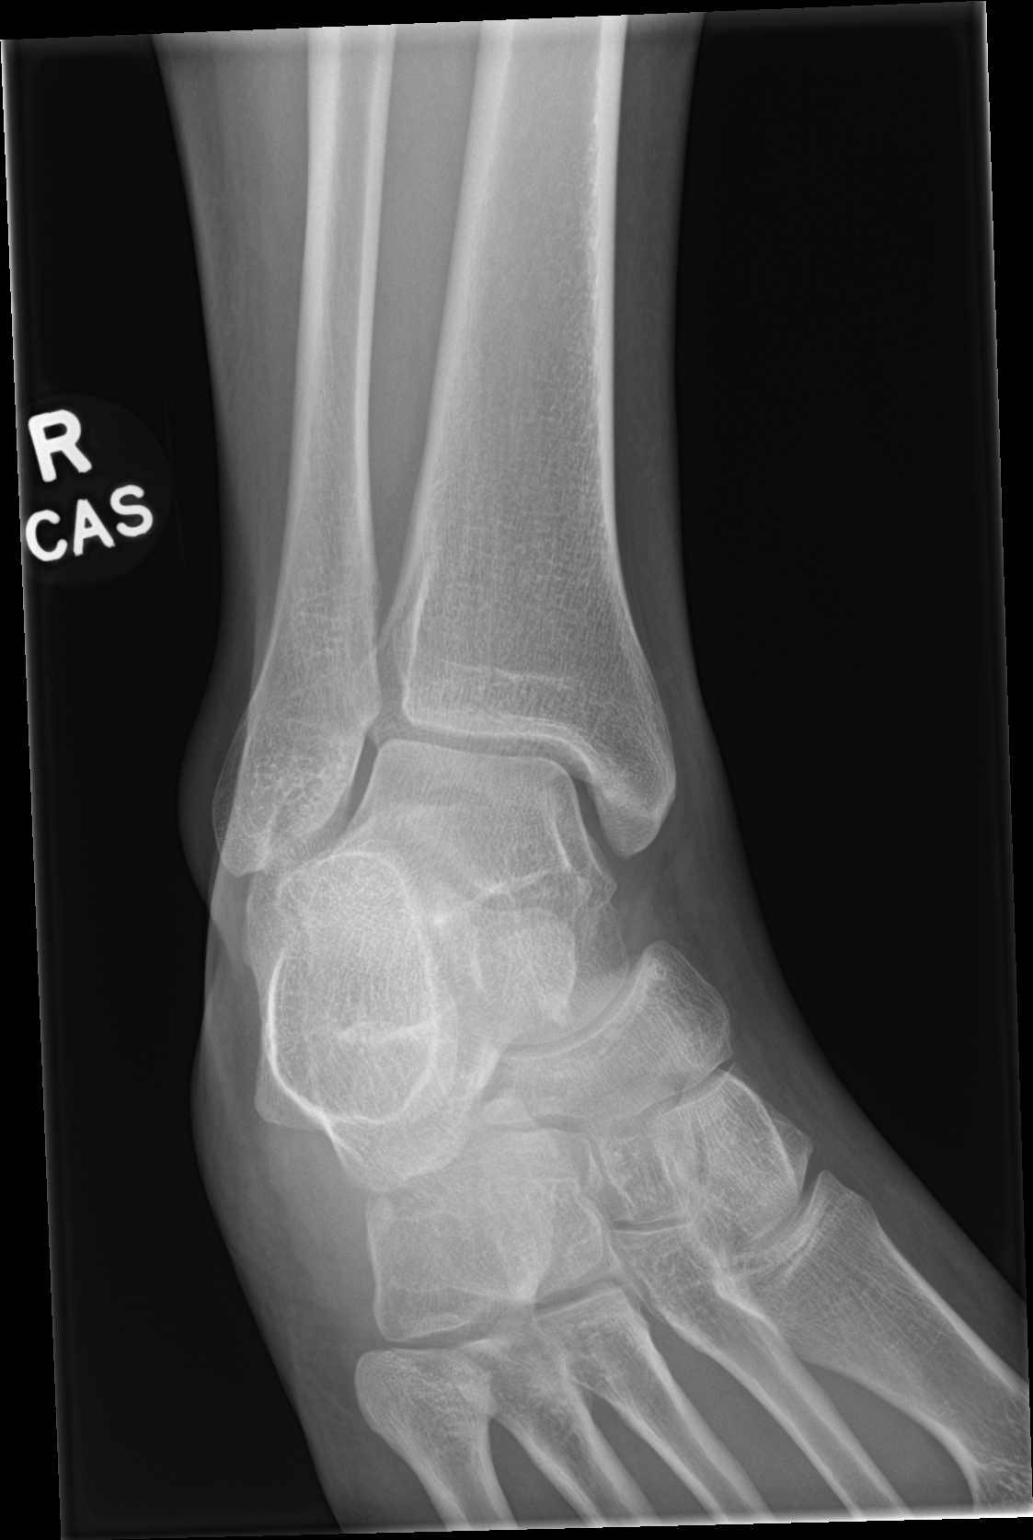

[ankle lat]
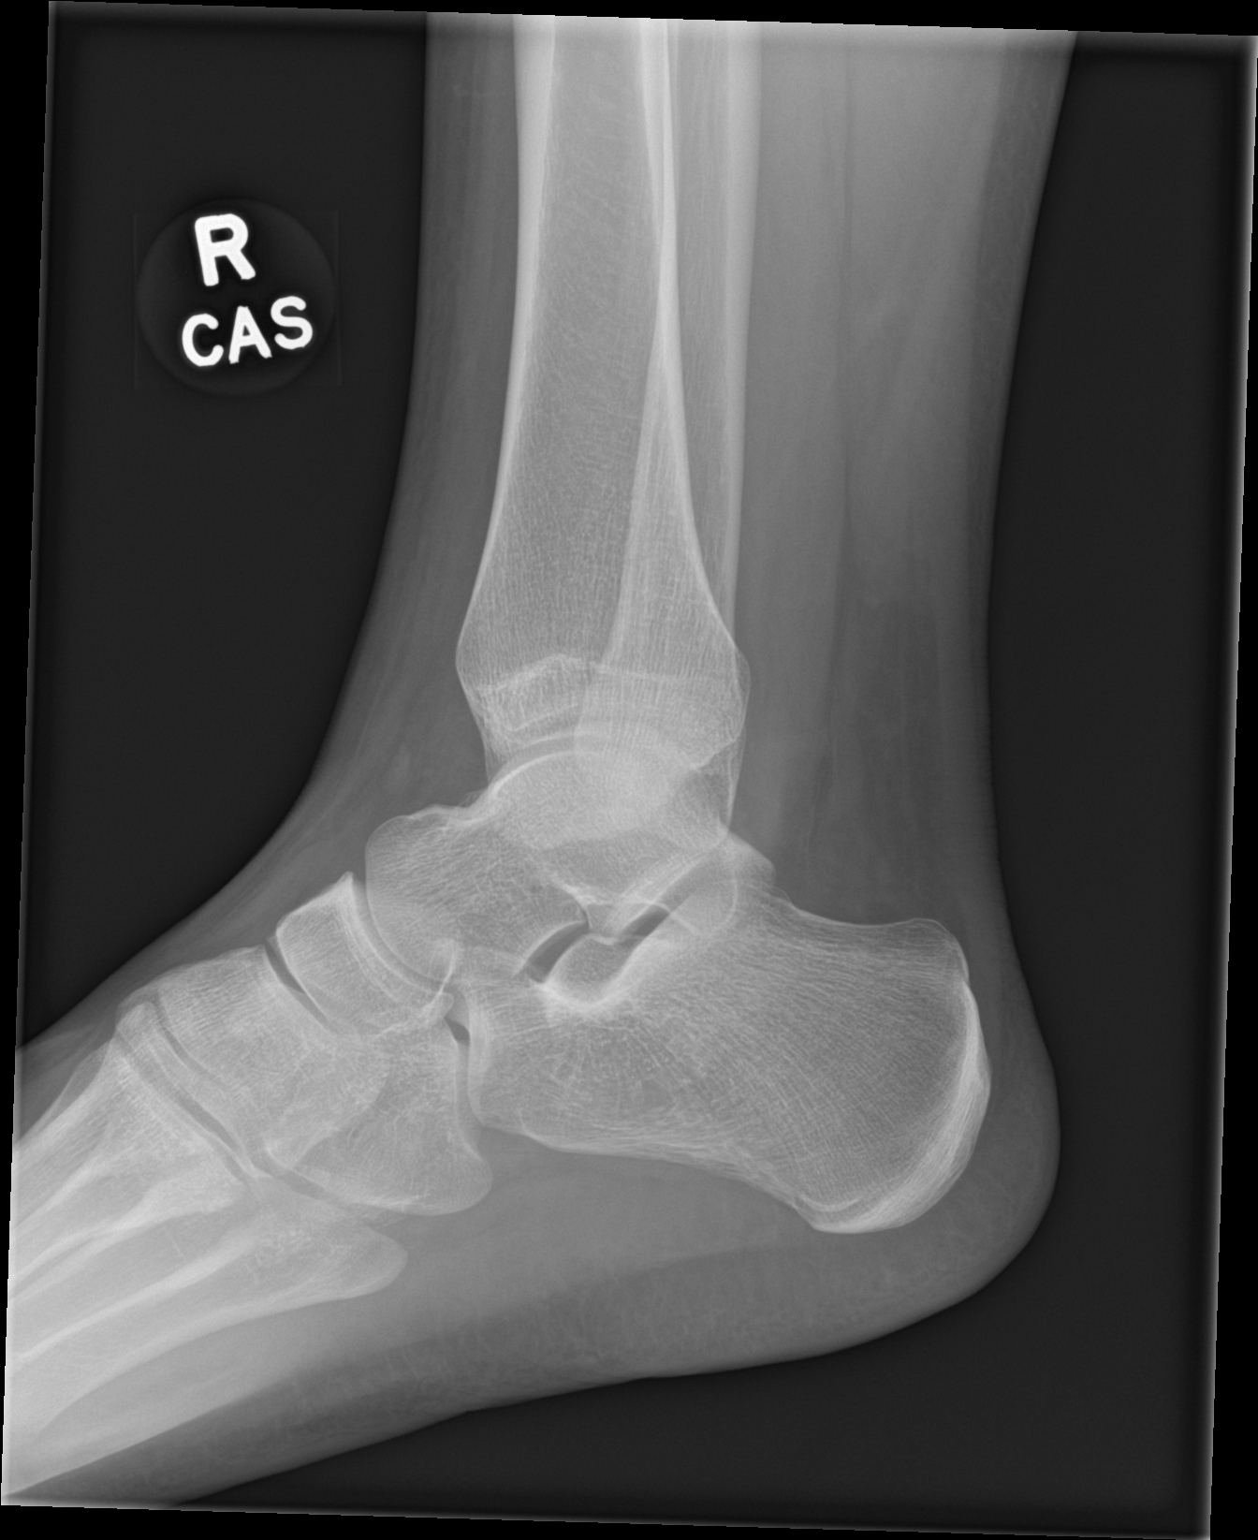

[3 of 3 positions shown; findings below may reference images not displayed]

FINDINGS: Mild diffuse soft tissue swelling. No fracture, dislocation or
effusion.
IMPRESSION: No fracture.

## 2019-06-04 ENCOUNTER — Telehealth: Payer: Self-pay | Admitting: Family Medicine

## 2019-06-04 NOTE — Telephone Encounter (Signed)
Spoke with pt, explained that we need lab results to send with GI referral to [facility]  Call was breaking up then dropped

## 2019-06-10 ENCOUNTER — Ambulatory Visit: Payer: 59 | Admitting: Licensed Clinical Social Worker

## 2019-06-10 ENCOUNTER — Ambulatory Visit: Payer: 59 | Admitting: Child and Adolescent Psychiatry

## 2019-06-10 ENCOUNTER — Other Ambulatory Visit: Payer: Self-pay

## 2019-06-11 ENCOUNTER — Telehealth: Payer: Self-pay

## 2019-06-11 ENCOUNTER — Other Ambulatory Visit (HOSPITAL_COMMUNITY): Payer: Self-pay | Admitting: Psychiatry

## 2019-06-11 DIAGNOSIS — F330 Major depressive disorder, recurrent, mild: Secondary | ICD-10-CM

## 2019-06-11 DIAGNOSIS — F418 Other specified anxiety disorders: Secondary | ICD-10-CM

## 2019-06-11 DIAGNOSIS — F422 Mixed obsessional thoughts and acts: Secondary | ICD-10-CM

## 2019-06-11 MED ORDER — FLUOXETINE HCL 20 MG PO TABS: 20.0000 mg | ORAL_TABLET | Freq: Every day | ORAL | 0 refills | Status: DC

## 2019-06-11 MED ORDER — FLUOXETINE HCL 10 MG PO TABS: ORAL_TABLET | ORAL | 0 refills | Status: DC

## 2019-06-11 MED ORDER — HYDROXYZINE HCL 25 MG PO TABS: 25.0000 mg | ORAL_TABLET | Freq: Every evening | ORAL | 0 refills | Status: DC | PRN

## 2019-06-11 NOTE — Telephone Encounter (Signed)
sent 

## 2019-06-11 NOTE — Telephone Encounter (Signed)
pt states he will not have enough medications to get to his next appt on  07-19-19

## 2019-06-23 LAB — CBC WITH DIFFERENTIAL
Basophils Absolute: 0 10*3/uL (ref 0.0–0.2)
Basos: 0 %
EOS (ABSOLUTE): 0.1 10*3/uL (ref 0.0–0.4)
Eos: 1 %
Hematocrit: 43.2 % (ref 37.5–51.0)
Hemoglobin: 15 g/dL (ref 13.0–17.7)
Immature Grans (Abs): 0 10*3/uL (ref 0.0–0.1)
Immature Granulocytes: 0 %
Lymphocytes Absolute: 3.8 10*3/uL — ABNORMAL HIGH (ref 0.7–3.1)
Lymphs: 41 %
MCH: 30.7 pg (ref 26.6–33.0)
MCHC: 34.7 g/dL (ref 31.5–35.7)
MCV: 89 fL (ref 79–97)
Monocytes Absolute: 0.7 10*3/uL (ref 0.1–0.9)
Monocytes: 8 %
Neutrophils Absolute: 4.7 10*3/uL (ref 1.4–7.0)
Neutrophils: 50 %
RBC: 4.88 x10E6/uL (ref 4.14–5.80)
RDW: 13.1 % (ref 11.6–15.4)
WBC: 9.3 10*3/uL (ref 3.4–10.8)

## 2019-06-23 LAB — COMPREHENSIVE METABOLIC PANEL
ALT: 31 IU/L (ref 0–44)
AST: 20 IU/L (ref 0–40)
Albumin/Globulin Ratio: 1.9 (ref 1.2–2.2)
Albumin: 4.7 g/dL (ref 4.1–5.2)
Alkaline Phosphatase: 102 IU/L (ref 56–127)
BUN/Creatinine Ratio: 11 (ref 9–20)
BUN: 11 mg/dL (ref 6–20)
Bilirubin Total: 0.7 mg/dL (ref 0.0–1.2)
CO2: 26 mmol/L (ref 20–29)
Calcium: 9.2 mg/dL (ref 8.7–10.2)
Chloride: 97 mmol/L (ref 96–106)
Creatinine, Ser: 0.96 mg/dL (ref 0.76–1.27)
GFR calc Af Amer: 133 mL/min/{1.73_m2} (ref >59)
GFR calc non Af Amer: 115 mL/min/{1.73_m2} (ref >59)
Globulin, Total: 2.5 g/dL (ref 1.5–4.5)
Glucose: 87 mg/dL (ref 65–99)
Potassium: 4.5 mmol/L (ref 3.5–5.2)
Sodium: 139 mmol/L (ref 134–144)
Total Protein: 7.2 g/dL (ref 6.0–8.5)

## 2019-06-23 LAB — VITAMIN D 25 HYDROXY (VIT D DEFICIENCY, FRACTURES): Vit D, 25-Hydroxy: 16.7 ng/mL — ABNORMAL LOW (ref 30.0–100.0)

## 2019-06-23 LAB — TSH: TSH: 1.93 u[IU]/mL (ref 0.450–4.500)

## 2019-06-23 LAB — VITAMIN B12: Vitamin B-12: 537 pg/mL (ref 232–1245)

## 2019-06-24 ENCOUNTER — Telehealth: Payer: Self-pay | Admitting: Psychiatry

## 2019-06-24 NOTE — Telephone Encounter (Signed)
I have reviewed labs dated 06/23/2019-  CBC with differential-lymphocytes-3.8-high otherwise within normal limits.  TSH-within normal limits  Vitamin D-16.7-low.  Vitamin B12-537-within normal limits.  Will advise to follow-up with primary care provider- we will asked CMA to fax labs to his primary care and let patient know.

## 2019-06-27 ENCOUNTER — Inpatient Hospital Stay (HOSPITAL_COMMUNITY)
Admission: RE | Admit: 2019-06-27 | Discharge: 2019-07-02 | DRG: 885 | Disposition: A | Discharge status: Home / Self Care | Payer: 59 | Attending: Psychiatry | Admitting: Psychiatry

## 2019-06-27 DIAGNOSIS — G4700 Insomnia, unspecified: Secondary | ICD-10-CM | POA: Diagnosis present

## 2019-06-27 DIAGNOSIS — Z818 Family history of other mental and behavioral disorders: Secondary | ICD-10-CM | POA: Diagnosis not present

## 2019-06-27 DIAGNOSIS — F429 Obsessive-compulsive disorder, unspecified: Secondary | ICD-10-CM | POA: Diagnosis present

## 2019-06-27 DIAGNOSIS — Z91410 Personal history of adult physical and sexual abuse: Secondary | ICD-10-CM | POA: Diagnosis not present

## 2019-06-27 DIAGNOSIS — F649 Gender identity disorder, unspecified: Secondary | ICD-10-CM | POA: Diagnosis present

## 2019-06-27 DIAGNOSIS — F332 Major depressive disorder, recurrent severe without psychotic features: Principal | ICD-10-CM | POA: Diagnosis present

## 2019-06-27 DIAGNOSIS — R45851 Suicidal ideations: Secondary | ICD-10-CM

## 2019-06-27 DIAGNOSIS — F411 Generalized anxiety disorder: Secondary | ICD-10-CM | POA: Diagnosis present

## 2019-06-27 DIAGNOSIS — Z79899 Other long term (current) drug therapy: Secondary | ICD-10-CM | POA: Diagnosis not present

## 2019-06-27 DIAGNOSIS — F514 Sleep terrors [night terrors]: Secondary | ICD-10-CM | POA: Diagnosis present

## 2019-06-27 DIAGNOSIS — Z91411 Personal history of adult psychological abuse: Secondary | ICD-10-CM | POA: Diagnosis not present

## 2019-06-27 DIAGNOSIS — Z20828 Contact with and (suspected) exposure to other viral communicable diseases: Secondary | ICD-10-CM | POA: Diagnosis present

## 2019-06-27 HISTORY — DX: Other specified health status: Z78.9

## 2019-06-27 LAB — SARS CORONAVIRUS 2 BY RT PCR (HOSPITAL ORDER, PERFORMED IN ~~LOC~~ HOSPITAL LAB): SARS Coronavirus 2: NEGATIVE

## 2019-06-27 MED ORDER — MAGNESIUM HYDROXIDE 400 MG/5ML PO SUSP: 15.0000 mL | Freq: Every evening | ORAL | Status: DC | PRN

## 2019-06-27 MED ORDER — HYDROXYZINE HCL 25 MG PO TABS: 25.0000 mg | ORAL_TABLET | Freq: Every evening | ORAL | Status: DC | PRN

## 2019-06-27 MED ORDER — ALUM & MAG HYDROXIDE-SIMETH 200-200-20 MG/5ML PO SUSP: 30.0000 mL | Freq: Four times a day (QID) | ORAL | Status: DC | PRN

## 2019-06-27 MED ORDER — FLUOXETINE HCL 10 MG PO CAPS
30.0000 mg | ORAL_CAPSULE | Freq: Every day | ORAL | Status: DC
  Administered 2019-06-28: 14:00:00 30 mg via ORAL
  Filled 2019-06-27 (×3): qty 3
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
## 2019-06-27 NOTE — H&P (Signed)
Behavioral Health Medical Screening Exam  Richard Raymond is an 18 y.o. male.  Total Time spent with patient: 20 minutes  Psychiatric Specialty Exam: Physical Exam  Constitutional: He is oriented to person, place, and time. He appears well-developed and well-nourished. No distress.  HENT:  Head: Normocephalic and atraumatic.  Right Ear: External ear normal.  Left Ear: External ear normal.  Eyes: Pupils are equal, round, and reactive to light. Right eye exhibits no discharge. Left eye exhibits no discharge. No scleral icterus.  Respiratory: Effort normal. No respiratory distress.  Musculoskeletal: Normal range of motion.  Neurological: He is alert and oriented to person, place, and time.  Skin: He is not diaphoretic.  Psychiatric: His mood appears anxious. He is not withdrawn and not actively hallucinating. Thought content is not paranoid and not delusional. He exhibits a depressed mood. He expresses suicidal ideation. He expresses no homicidal ideation. He expresses suicidal plans.    Review of Systems  Psychiatric/Behavioral: Positive for depression and suicidal ideas. Negative for hallucinations, memory loss and substance abuse. The patient is nervous/anxious and has insomnia.     Blood pressure 119/63, pulse (!) 102, temperature 99.4 F (37.4 C), temperature source Oral, resp. rate 16, SpO2 95 %.There is no height or weight on file to calculate BMI.  General Appearance: Casual and Fairly Groomed  Eye Contact:  Fair  Speech:  Clear and Coherent and Normal Rate  Volume:  Decreased  Mood:  Depressed, Hopeless and Worthless  Affect:  Congruent and Depressed  Thought Process:  Coherent and Descriptions of Associations: Intact  Orientation:  Full (Time, Place, and Person)  Thought Content:  Logical and Hallucinations: None  Suicidal Thoughts:  Yes.  with intent/plan  Homicidal Thoughts:  No  Memory:  Immediate;   Fair Recent;   Fair  Judgement:  Impaired  Insight:  Lacking   Psychomotor Activity:  Decreased  Concentration: Concentration: Fair and Attention Span: Fair  Recall:  Good  Fund of Knowledge:Good  Language: Good  Akathisia:  Negative  Handed:  Right  AIMS (if indicated):     Assets:  Communication Skills Desire for Improvement Financial Resources/Insurance Housing Leisure Time Physical Health Social Support Transportation  Sleep:       Musculoskeletal: Strength & Muscle Tone: within normal limits Gait & Station: normal Patient leans: N/A  Blood pressure 119/63, pulse (!) 102, temperature 99.4 F (37.4 C), temperature source Oral, resp. rate 16, SpO2 95 %.  Recommendations:  Based on my evaluation the patient does not appear to have an emergency medical condition.  Rozetta Nunnery, NP 06/27/2019, 9:14 PM

## 2019-06-28 ENCOUNTER — Other Ambulatory Visit: Payer: Self-pay

## 2019-06-28 ENCOUNTER — Encounter (HOSPITAL_COMMUNITY): Payer: Self-pay

## 2019-06-28 DIAGNOSIS — F332 Major depressive disorder, recurrent severe without psychotic features: Principal | ICD-10-CM

## 2019-06-28 DIAGNOSIS — R45851 Suicidal ideations: Secondary | ICD-10-CM

## 2019-06-28 LAB — COMPREHENSIVE METABOLIC PANEL
ALT: 30 U/L (ref 0–44)
AST: 19 U/L (ref 15–41)
Albumin: 4.3 g/dL (ref 3.5–5.0)
Alkaline Phosphatase: 94 U/L (ref 38–126)
Anion gap: 11 (ref 5–15)
BUN: 12 mg/dL (ref 6–20)
CO2: 26 mmol/L (ref 22–32)
Calcium: 9.1 mg/dL (ref 8.9–10.3)
Chloride: 104 mmol/L (ref 98–111)
Creatinine, Ser: 0.95 mg/dL (ref 0.61–1.24)
GFR calc Af Amer: >60 mL/min (ref >60)
GFR calc non Af Amer: >60 mL/min (ref >60)
Glucose, Bld: 91 mg/dL (ref 70–99)
Potassium: 4.1 mmol/L (ref 3.5–5.1)
Sodium: 141 mmol/L (ref 135–145)
Total Bilirubin: 0.7 mg/dL (ref 0.3–1.2)
Total Protein: 7.8 g/dL (ref 6.5–8.1)

## 2019-06-28 LAB — LIPID PANEL
Cholesterol: 197 mg/dL — ABNORMAL HIGH (ref 0–169)
HDL: 29 mg/dL — ABNORMAL LOW (ref >40)
LDL Cholesterol: 151 mg/dL — ABNORMAL HIGH (ref 0–99)
Total CHOL/HDL Ratio: 6.8 RATIO
Triglycerides: 87 mg/dL (ref <150)
VLDL: 17 mg/dL (ref 0–40)

## 2019-06-28 LAB — HEMOGLOBIN A1C
Hgb A1c MFr Bld: 5.3 % (ref 4.8–5.6)
Mean Plasma Glucose: 105.41 mg/dL

## 2019-06-28 MED ORDER — FLUOXETINE HCL 20 MG PO CAPS
40.0000 mg | ORAL_CAPSULE | Freq: Every day | ORAL | Status: DC
  Administered 2019-06-29 – 2019-07-02 (×4): 40 mg via ORAL
  Filled 2019-06-28 (×8): qty 2

## 2019-06-28 NOTE — Tx Team (Addendum)
Interdisciplinary Treatment and Diagnostic Plan Update  06/28/2019 Time of Session: 10 AM Richard PaymentBrysen Raymond MRN: [id number]  Principal Diagnosis: Severe recurrent major depression without psychotic features (HCC)  Secondary Diagnoses: Active Problems:   Severe recurrent major depression without psychotic features (HCC)   Current Medications:  Current Facility-Administered Medications  Medication Dose Route Frequency Provider Last Rate Last Dose  . alum & mag hydroxide-simeth (MAALOX/MYLANTA) 200-200-20 MG/5ML suspension 30 mL  30 mL Oral Q6H PRN Nira ConnBerry, Jason A, NP      . FLUoxetine (PROZAC) capsule 30 mg  30 mg Oral Daily Nira ConnBerry, Jason A, NP      . hydrOXYzine (ATARAX/VISTARIL) tablet 25 mg  25 mg Oral QHS PRN Nira ConnBerry, Jason A, NP      . magnesium hydroxide (MILK OF MAGNESIA) suspension 15 mL  15 mL Oral QHS PRN Jackelyn PolingBerry, Jason A, NP       PTA Medications: Medications Prior to Admission  Medication Sig Dispense Refill Last Dose  . FLUoxetine (PROZAC) 10 MG tablet TAKE 1 TABLET BY MOUTH DAILY TO BE COMBINED WITH PROZAC 20 MG DAILY. 30 tablet 0 06/27/2019  . FLUoxetine (PROZAC) 20 MG tablet Take 1 tablet (20 mg total) by mouth daily. 30 tablet 0 06/27/2019  . hydrOXYzine (ATARAX/VISTARIL) 25 MG tablet Take 1 tablet (25 mg total) by mouth at bedtime as needed. for sleep 30 tablet 0 06/27/2019    Patient Stressors: Other: Transgender Male to Male  Patient Strengths: Ability for insight Active sense of humor Average or above average intelligence General fund of knowledge Supportive family/friends  Treatment Modalities: Medication Management, Group therapy, Case management,  1 to 1 session with clinician, Psychoeducation, Recreational therapy.   Physician Treatment Plan for Primary Diagnosis: <principal problem not specified> Long Term Goal(s):     Short Term Goals:    Medication Management: Evaluate patient's response, side effects, and tolerance of medication regimen.  Therapeutic  Interventions: 1 to 1 sessions, Unit Group sessions and Medication administration.  Evaluation of Outcomes: Progressing  Physician Treatment Plan for Secondary Diagnosis: Active Problems:   Severe recurrent major depression without psychotic features (HCC)  Long Term Goal(s):     Short Term Goals:       Medication Management: Evaluate patient's response, side effects, and tolerance of medication regimen.  Therapeutic Interventions: 1 to 1 sessions, Unit Group sessions and Medication administration.  Evaluation of Outcomes: Progressing   RN Treatment Plan for Primary Diagnosis: Severe recurrent major depression without psychotic features (HCC) Long Term Goal(s): Knowledge of disease and therapeutic regimen to maintain health will improve  Short Term Goals: Ability to remain free from injury will improve, Ability to demonstrate self-control, Ability to verbalize feelings will improve and Ability to identify and develop effective coping behaviors will improve  Medication Management: RN will administer medications as ordered by provider, will assess and evaluate patient's response and provide education to patient for prescribed medication. RN will report any adverse and/or side effects to prescribing provider.  Therapeutic Interventions: 1 on 1 counseling sessions, Psychoeducation, Medication administration, Evaluate responses to treatment, Monitor vital signs and CBGs as ordered, Perform/monitor CIWA, COWS, AIMS and Fall Risk screenings as ordered, Perform wound care treatments as ordered.  Evaluation of Outcomes: Progressing   LCSW Treatment Plan for Primary Diagnosis: Severe recurrent major depression without psychotic features (HCC) Long Term Goal(s): Safe transition to appropriate next level of care at discharge, Engage patient in therapeutic group addressing interpersonal concerns.  Short Term Goals: Engage patient in aftercare planning with referrals and  resources, Increase  ability to appropriately verbalize feelings, Increase emotional regulation and Increase skills for wellness and recovery  Therapeutic Interventions: Assess for all discharge needs, 1 to 1 time with Social worker, Explore available resources and support systems, Assess for adequacy in community support network, Educate family and significant other(s) on suicide prevention, Complete Psychosocial Assessment, Interpersonal group therapy.  Evaluation of Outcomes: Progressing   Progress in Treatment: Attending groups: Yes. Participating in groups: Yes. Taking medication as prescribed: Yes. Toleration medication: Yes. Family/Significant other contact made: No, will contact:  Pt is 18 years old and PSA will be completed with pt. If he gives consent for writer to speak with parent/guardian she will do so Patient understands diagnosis: Yes. Discussing patient identified problems/goals with staff: Yes. Medical problems stabilized or resolved: Yes. Denies suicidal/homicidal ideation: As evidenced by:  Contracts for safety on the unit Issues/concerns per patient self-inventory: No. Other: N/A  New problem(s) identified: No, Describe:  None reported   New Short Term/Long Term Goal(s):Safe transition to appropriate next level of care at discharge, Engage patient in therapeutic group addressing interpersonal concerns.   Short Term Goals: Engage patient in aftercare planning with referrals and resources, Increase ability to appropriately verbalize feelings, Increase emotional regulation and Increase skills for wellness and recovery  Patient Goals: "To figure out betters ways to cope with stress."   Discharge Plan or Barriers: Pt will return to parent/guardian care and follow up with outpatient therapy and medication management services.   Reason for Continuation of Hospitalization: Depression Medication stabilization Suicidal ideation  Estimated Length of Stay:08/07  Attendees: Patient:Richard  Raymond  06/28/2019 11:19 AM  Physician: Dr. Louretta Shorten 06/28/2019 11:19 AM  Nursing: Marnee Guarneri, RN 06/28/2019 11:19 AM  RN Care Manager: 06/28/2019 11:19 AM  Social Worker: Manson Passey Cozart, LCSWA 06/28/2019 11:19 AM  Recreational Therapist:  06/28/2019 11:19 AM  Other: PA Intern 06/28/2019 11:19 AM  Other: PA Intern 06/28/2019 11:19 AM  Other:Richard Warner Mccreedy, RN 06/28/2019 11:19 AM    Scribe for Treatment Team: Manson Passey Cozart, LCSWA 06/28/2019 11:19 AM   Laquitia S. [place], [place], MSW [facility]: Child and Adolescent  [phone]

## 2019-06-28 NOTE — Progress Notes (Signed)
[place] NOVEL CORONAVIRUS (COVID-19) DAILY CHECK-OFF SYMPTOMS - answer yes or no to each - every day NO YES  Have you had a fever in the past 24 hours?  . Fever (Temp > 37.80C / 100F) X   Have you had any of these symptoms in the past 24 hours? . New Cough .  Sore Throat  .  Shortness of Breath .  Difficulty Breathing .  Unexplained Body Aches   X   Have you had any one of these symptoms in the past 24 hours not related to allergies?   . Runny Nose .  Nasal Congestion .  Sneezing   X   If you have had runny nose, nasal congestion, sneezing in the past 24 hours, has it worsened?  X   EXPOSURES - check yes or no X   Have you traveled outside the state in the past 14 days?  X   Have you been in contact with someone with a confirmed diagnosis of COVID-19 or PUI in the past 14 days without wearing appropriate PPE?  X   Have you been living in the same home as a person with confirmed diagnosis of COVID-19 or a PUI (household contact)?    X   Have you been diagnosed with COVID-19?    X              What to do next: Answered NO to all: Answered YES to anything:   Proceed with unit schedule Follow the [facility] Inpatient Flowsheet.   

## 2019-06-28 NOTE — [facility] Suicide Risk Assessment (Signed)
[facility]   Nursing information obtained from:  Patient, Review of record Demographic factors:  Male, Caucasian, Gay, lesbian, or bisexual orientation, Adolescent or young adult Current Mental Status:  Suicidal ideation indicated by patient, Plan includes specific time, place, or method Loss Factors:  NA Historical Factors:  Impulsivity Risk Reduction Factors:  Living with another person, especially a relative  Total Time spent with patient: 1 hour Principal Problem: Severe recurrent major depression without psychotic features (HCC) Diagnosis:  Principal Problem:   Severe recurrent major depression without psychotic features (HCC) Active Problems:   Suicide ideation  Subjective Data: Richard Raymond is 18 years old male to male transition reportedly stage of contemplation, depression, OCD and suicidal ideation admitted to the behavioral health Hospital.  Patient reported he has been depressed and periodically suicidal for a while lately switching his into action of the suicidal thoughts due to excessive stress from the school and family.  Reportedly patient has been in home schooling and try to get high school he: Test because he did not complete his high school and did not get the diploma.  Patient also reported parents has been separated for the last 1 year and is having hard time to cope with the separation.  Patient reportedly has been receiving therapy from Dr. Joni ReiningNicole and also seeking [facility].  Reportedly has been taking 30 mg of Prozac for the last 3 months and hydroxyzine 25 mg 1/2 tablet at bedtime because whole tablet makes him knocked out.  Patient reportedly has self-injurious behavior and reportedly had multiple superficial lacerations which does not required any suturing.  Continued Clinical Symptoms:    The "Alcohol Use Disorders Identification Test", Guidelines for Use in Primary Care,  Second Edition.  World Science writerHealth Organization [facility](WHO). Score between 0-7:  no or low risk or alcohol related problems. Score between 8-15:  moderate risk of alcohol related problems. Score between 16-19:  high risk of alcohol related problems. Score 20 or above:  warrants further diagnostic evaluation for alcohol dependence and treatment.   CLINICAL FACTORS:   Severe Anxiety and/or Agitation Depression:   Anhedonia Hopelessness Impulsivity Insomnia Recent sense of peace/wellbeing Severe Obsessive-Compulsive Disorder More than one psychiatric diagnosis Unstable or Poor Therapeutic Relationship Previous Psychiatric Diagnoses and Treatments Medical Diagnoses and Treatments/Surgeries   Musculoskeletal: Strength & Muscle Tone: within normal limits Gait & Station: normal Patient leans: N/A  Psychiatric Specialty Exam: Physical Exam as per history and physical  Review of Systems  Constitutional: Negative.   HENT: Negative.   Eyes: Negative.   Respiratory: Negative.   Cardiovascular: Negative.   Gastrointestinal: Negative.   Skin: Negative.   Neurological: Negative.   Endo/Heme/Allergies: Negative.   Psychiatric/Behavioral: Positive for depression and suicidal ideas. The patient is nervous/anxious and has insomnia.      Blood pressure 123/66, pulse 78, temperature 98.2 F (36.8 C), temperature source Oral, resp. rate 16, height 5' 4.57" (1.64 m), weight 93.5 kg, SpO2 95 %.Body mass index is 34.76 kg/m.  General Appearance: Casual and Fairly Groomed  Eye Contact:  Fair  Speech:  Clear and Coherent and Normal Rate  Volume:  Decreased  Mood:  Depressed, Hopeless and Worthless  Affect:  Congruent and Depressed  Thought Process:  Coherent and Descriptions of Associations: Intact  Orientation:  Full (Time, Place, and Person)  Thought Content:  Logical and Hallucinations: None  Suicidal Thoughts:  Yes.  with intent/plan  Homicidal Thoughts:  No  Memory:  Immediate;   Fair Recent;    Fair  Judgement:  Impaired  Insight:  Lacking  Psychomotor Activity:  Decreased  Concentration: Concentration: Fair and Attention Span: Fair  Recall:  Good  Fund of Knowledge:Good  Language: Good  Akathisia:  Negative  Handed:  Right  AIMS (if indicated):     Assets:  Communication Skills Desire for Improvement Financial Resources/Insurance Housing Leisure Time Physical Health Social Support Transportation    Sleep:         COGNITIVE FEATURES THAT CONTRIBUTE TO RISK:  Closed-mindedness, Loss of executive function, Polarized thinking and Thought constriction (tunnel vision)    SUICIDE RISK:   Severe:  Frequent, intense, and enduring suicidal ideation, specific plan, no subjective intent, but some objective markers of intent (i.e., choice of lethal method), the method is accessible, some limited preparatory behavior, evidence of impaired self-control, severe dysphoria/symptomatology, multiple risk factors present, and few if any protective factors, particularly a lack of social support.  PLAN OF CARE: Admit for worsening symptoms of depression anxiety and suicidal ideation with self-injurious behaviors unable to contract for safety.  Patient needed crisis stabilization, safety monitoring and medication management.  I certify that inpatient services furnished can reasonably be expected to improve the patient's condition.   Ambrose Finland, MD 06/28/2019, 12:53 PM

## 2019-06-28 NOTE — Progress Notes (Signed)
Nursing Progress Note: 7-7p  D- Mood is depressed and guarded. Pt has been isolating in his room.Reports he was planning to commit suicide today but he had no plan."I've been depressed for awhile  and I usually sleep a lot and stay in my room at home". Pt is very slow to engaged.. Pt is able to contract for safety. Goal for today is learn a new way to manage stress  A - Observed pt mininmally interacting in group and in the milieu.Support and encouragement offered, safety maintained with q 15 minutes.   R-Contracts for safety and continues to follow treatment plan, working on learning new coping skills.

## 2019-06-28 NOTE — [facility] Group Notes (Signed)
[place] LCSW Group Therapy Note  Date/Time: 06/28/2019 3 PM  Type of Therapy/Topic:  Group Therapy:  Balance in Life  Participation Level: Active   Description of Group:    This group will address the concept of balance and how it feels and looks when one is unbalanced. Patients will be encouraged to process areas in their lives that are out of balance, and identify reasons for remaining unbalanced. Facilitators will guide patients utilizing problem- solving interventions to address and correct the stressor making their life unbalanced. Understanding and applying boundaries will be explored and addressed for obtaining  and maintaining a balanced life. Patients will be encouraged to explore ways to assertively make their unbalanced needs known to significant others in their lives, using other group members and facilitator for support and feedback.  Therapeutic Goals: 1. Patient will identify two or more emotions or situations they have that consume much of in their lives. 2. Patient will identify signs/triggers that life has become out of balance:  3. Patient will identify two ways to set boundaries in order to achieve balance in their lives:  4. Patient will demonstrate ability to communicate their needs through discussion and/or role plays  Summary of Patient Progress: Group members engaged in discussion about balance in life and discussed what factors lead to feeling balanced in life and what it looks like to feel balanced. Group members took turns writing things on the board such as relationships, communication, coping skills, trust, food, understanding and mood as factors to keep self balanced. Group members also identified ways to better manage self when being out of balance. Patient identified factors that led to being out of balance as communication and self esteem. Pt presents withdepressed mood and flat affect. During check ins he describes his mood as "well rested I guess." He shares  things that cause his life to feel unbalanced. These are"video games, chores, family, school work, spending time with friends, analyzing things and hating myself." Out of those"video games and hating myself"is taking up the most amount of his time.Signs and triggers that life is unbalanced are "sometimes I isolate myself when I don't want to be alone. I often under or over eat." His balanced life includes "making music, family, friends, sleep, work and chores." Two changes shecan make to lead a more balanced life are"work towards not hating myself and spend less time on video games."These changes will positively impact her mental health by "making me more energetic and less anxious."    Therapeutic Modalities:   Cognitive Behavioral Therapy Solution-Focused Therapy Assertiveness Training  Zorawar Strollo S Oney Folz MSW, LCSWA  Joane Postel S. Scott, [place], MSW [facility]: Child and Adolescent  [phone]

## 2019-06-28 NOTE — Progress Notes (Addendum)
Admitted this patient after Covid result returned negative. He was transferred to from room 204 # to room 601# . Patient has a hx of MDD,Anxiety,and OCD. He is currently in "contemplation" stage of  transition from male to male. He does currently use male pronouns,"for simplicity sake." Tonight he came in for reported suicidal thoughts with he says multiple plans which he does not discuss. Appears to currently have some thought blocking ,reports he is tired and reports he had been asleep. He has superficial scratches to his left forearm and reports this is the first time he has self-injured.Richard Raymond reports a hx of abdominal migraines. He had some difficulty initially contracting for safety. "I think I can." He did contract for safety and says he will come to staff if he feels like he he is going to harm himself.

## 2019-06-28 NOTE — Progress Notes (Signed)
Recreation Therapy Notes   Date:  06/28/19 Time: 10:30- 11:00 am  Location: Courtyard  Group Topic: Goal Setting, identifying change  Goal Area(s) Addresses:  Patient will successfully set 1 goal for their future during group.  Patient will successfully identify benefit of setting goals. Patient will successfully identify one thing they want to change in the future.   Patient will successfully fill out their daily self inventory sheet.   Behavioral Response: appropriate, quiet, guarded  Intervention: Group Discussion  Activity: Patients were informed of group rules and expectations. Patients and Writer then discussed daily goals, how yesterdays goals were met, and set new goals for their day. Patient filled out the daily self inventory sheet and shared their responses with the group.   Education Outcome:  Acknowledges education In group clarification offered   Clinical Observations/Feedback: Patient stated their daily goal was "learn a new way to manage stress".  Richard Raymond, LRT/CTRS        Richard Raymond L Richard Raymond 06/28/2019 2:46 PM
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
## 2019-06-28 NOTE — Tx Team (Signed)
Initial Treatment Plan 06/28/2019 12:40 AM Richard Raymond LXB:[id number]    PATIENT STRESSORS: Other: Transgender Male to Male   PATIENT STRENGTHS: Ability for insight Active sense of humor Average or above average intelligence General fund of knowledge Supportive family/friends   PATIENT IDENTIFIED PROBLEMS:   Ineffective Coping     Self-injury    Anxiety    Severe Depression with Suicidal Thoughts        DISCHARGE CRITERIA:  Improved stabilization in mood, thinking, and/or behavior Motivation to continue treatment in a less acute level of care Need for constant or close observation no longer present Reduction of life-threatening or endangering symptoms to within safe limits Verbal commitment to aftercare and medication compliance  PRELIMINARY DISCHARGE PLAN: Outpatient therapy  PATIENT/FAMILY INVOLVEMENT: This treatment plan has been presented to and reviewed with the patient, Richard Raymond, and/or family member, mom and dad  .  The patient and family have been given the opportunity to ask questions and make suggestions.  Reatha Harps, RN 06/28/2019, 12:40 AM
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
## 2019-06-29 LAB — DRUG PROFILE, UR, 9 DRUGS (LABCORP)
Amphetamines, Urine: NEGATIVE ng/mL
Barbiturate, Ur: NEGATIVE ng/mL
Benzodiazepine Quant, Ur: NEGATIVE ng/mL
Cannabinoid Quant, Ur: NEGATIVE ng/mL
Cocaine (Metab.): NEGATIVE ng/mL
Methadone Screen, Urine: NEGATIVE ng/mL
Opiate Quant, Ur: NEGATIVE ng/mL
Phencyclidine, Ur: NEGATIVE ng/mL
Propoxyphene, Urine: NEGATIVE ng/mL

## 2019-06-29 LAB — PROLACTIN: Prolactin: 17.5 ng/mL — ABNORMAL HIGH (ref 4.0–15.2)

## 2019-06-29 NOTE — [facility] Counselor (Signed)
Adult Comprehensive Assessment  Patient ID: Richard Raymond, adult   DOB: Oct 02, 2001, 18 y.o.   MRN: [id number]  Information Source: Information source: Patient  Current Stressors:  Patient states their primary concerns and needs for treatment are:: "Probably that I have not properly had someone to talk to like  therapist. I really needed someone to ask me the right questions to help me figure out my mental health. I have not been asking myself the right questions lately. The fact that I was at a point where I was having thoughts of suicide is proof of that." Patient states their goals for this hospitilization and ongoing recovery are:: "I going to try and deal with how I can't seem to stop with some things like binge reading for twenty hours. I binge on fan fiction and comics. My binges happen with things outside of reading. I aim to deal with my self-hatred that happens pretty often." Educational / Learning stressors: "moderately, yeah, I have been home schooled my entire life. Last year my parents separated and it ripped my schooling out from under me. I have been trying to take the college exam test. The deal is I start college or I start paying rent to my parents." Employment / Job issues: N/A- "I am interested in finding a job because it sounds like I am going to have to pay rent." Family Relationships: "Somewhat, some of them are completely stress free all the time, other stressful sometime and others stressful every minute I am with them. None of my immediate family is overly stressful every minute I am with them." Financial / Lack of resources (include bankruptcy): N/A Housing / Lack of housing: Pt lives with parents currently. 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 Yes as in, I  enjoy being around friends. Working in spending time with my friends around everyone elses schedule is stressful. My friends are relaxing and very open minded." Substance abuse: "No." Bereavement / Loss: "I am not sure because if I remember correctly, earlier this year or late last year my great grandfather on my dad's side passed away. I am not sure if I actually dealt with that or if I have been too emotionally dead inside to notice."  Living/Environment/Situation:  Living Arrangements: Parent Living conditions (as described by patient or guardian): Pt reports safe and stable living environment with parents. Who else lives in the home?: "Me, my mom, dad and younger brothers Richard Raymond and Richard Raymond." How long has patient lived in current situation?: Pt has lived with parents all of his life What is atmosphere in current home: Supportive, Loving, Comfortable("Do not feel it is supportive but it is not destructive either. It is kind of a loving environment with my mom. With my dad it is like I am there for you but only if you need me.")  Family History:  Marital status: Single What is your sexual orientation?: "I currently identify as male and I intend to transition to male. I want to be a bit further along when I do that." Has your sexual activity been affected by drugs, alcohol, medication, or emotional stress?: N/A Does patient have children?: No  Childhood History:  By whom was/is the patient raised?: Both parents Description of patient's relationship with caregiver when they were a child: "Early years until around  9 or 10 it was pretty good. After that, it started going down hill and I did not know it at first. Things came to a head when I was late [age] and early [age]. There was a lot of talk about them separating and they did when I turned 17."("The environment was bad to the point that I only left my room when nobody else was out in the home because I did not want to deal with anyone  fighting.") Patient's description of current relationship with people who raised him/her: "My relationship is pretty loving with mom now. With my dad it is like, I'm there for you, but just when you need me." How were you disciplined when you got in trouble as a child/adolescent?: "I got spankings for a while then they changed it to removing things when they figured out spanking did not work on me anymore." Does patient have siblings?: Yes Number of Siblings: 2 Description of patient's current relationship with siblings: "From toddler to [age] it was decent. With Richard Raymond it was bad at points because it was hard to be around him. During my early teenage years my relationship with them was non existent because I was hiding and not being around people. Once I came out of that, my relationship with my brothers has gotten better in the past 6 months to one year. Richard Raymond I would actually say our relationship is good. Richard Raymond bothers me alot because he enjoys talking and I like it quiet." Did patient suffer any verbal/emotional/physical/sexual abuse as a child?: No Did patient suffer from severe childhood neglect?: No Has patient ever been sexually abused/assaulted/raped as an adolescent or adult?: No Was the patient ever a victim of a crime or a disaster?: No Witnessed domestic violence?: No Has patient been effected by domestic violence as an adult?: No  Education:  Highest grade of school patient has completed: 12th grade Name of school: "I am home schooled through [employer]." Contact person: Occupational hygienistebbecca Bell-Mccaslin Learning disability?: No  Employment/Work Situation:   Employment situation: Surveyor, mineralstudent Patient's job has been impacted by current illness: Yes Describe how patient's job has been impacted: "Yes, when my parents separated it left my grades in shambles." What is the longest time patient has a held a job?: N/A Where was the patient employed at that time?: N/A Did You Receive Any Psychiatric  Treatment/Services While in the [place]?: No Are There Guns or Other Weapons in Your Home?: ("In my dad's house we do. We do not have ammunition, that and the guns are locked up to my knowledge. I would not be surprised if he did not lock them all up after my attempt.") Are These Weapons Safely Secured?: Yes  Financial Resources:   Financial resources: Support from parents / caregiver Does patient have a Lawyerrepresentative payee or guardian?: No  Alcohol/Substance Abuse:   What has been your use of drugs/alcohol within the last 12 months?: "I do not think so, unless you count anti acids to treat ulcers that form inside my mouth." If attempted suicide, did drugs/alcohol play a role in this?: No Alcohol/Substance Abuse Treatment Hx: Denies past history If yes, describe treatment: N/A Has alcohol/substance abuse ever caused legal problems?: No  Social Support System:   Patient's Community Support System: Fair Museum/gallery exhibitions officerDescribe Community Support System: "Fair when I can have access to my friends but otherwise poor." Type of faith/religion: N/A How does patient's faith help to cope with current illness?: N/A  Leisure/Recreation:   Leisure and Hobbies: "Video  games, comics, reading ,drawing, composing music, playing piano and chatting but only rarely."  Strengths/Needs:   What is the patient's perception of their strengths?: "probably my ability to come up with solutions to wierd situations, ability to come up with best solutions for things involving numbers too." Patient states they can use these personal strengths during their treatment to contribute to their recovery: "I do not know." Patient states these barriers may affect/interfere with their treatment: "possibly that my mom can't visit because of her work schedule, besides that no." Patient states these barriers may affect their return to the community: "Not really no." Other important information patient would like considered in planning for their  treatment: "No."  Discharge Plan:   Currently receiving community mental health services: Yes (From Whom)("At [facility] with Richard Raymond for medication and Richard Raymond for therapy. I have not had a proper session with Richard Raymond and it has been three months.") Patient states concerns and preferences for aftercare planning are: "I want someone who is available to talk to me about my mental health and helping me think through my thoughts about transitioning." Patient states they will know when they are safe and ready for discharge when: "once I have a plan with dealing with stress I cannot wait to deal with. I need a plan to deal with self-hatred when it happens." Does patient have access to transportation?: Yes Does patient have financial barriers related to discharge medications?: No Patient description of barriers related to discharge medications: None reported Will patient be returning to same living situation after discharge?: Yes  Summary/Recommendations:   Summary and Recommendations (to be completed by the evaluator): Pt is an 18 year old White male admitted due to suicidal ideation. Pt would not disclose his plans during admission. He has a history of self injurious behavior (cutting). He reports his transition from male to male is stressful. Pt is open to continue recieving outpatient [facility] and medication management.  Richard Raymond. 06/29/2019   Richard Raymond, LCSWA, MSW [facility]  [phone]

## 2019-06-29 NOTE — [facility] Counselor (Signed)
CSW completed PSA with patient as he is 18 years old. Pt provided consent for writer to speak with parent/mother regarding discharge date/time and SPE. Consent form will be placed on pt's chart.   Odas Ozer S. [place], [place], MSW [facility]: Child and Adolescent  [phone]

## 2019-06-29 NOTE — Progress Notes (Signed)
[facility] Progress Note  06/29/2019 1:15 PM Richard PaymentBrysen Raymond  MRN:  [id number] Subjective:  "I am feeling better since able to sleep and relax last night."  Patient seen by this MD, chart reviewed and case discussed with treatment team.  In brief:Richard Raymond an 18 y.o.malead[facility] to worsening anxiety, depression and suicidal ideation.  Patient has recurrent suicidal ideation with removal frequent and intense and was superficially cutting his forearm with a knife and said "I could have taken it a lot farther."   On evaluation the patient reported: Patient appeared with a depressed and anxious mood and his affect is constricted.  Patient has been psychomotor retardation and also has delayed verbal responses to the simple questions.  Patient reports he has been feeling more awake and energetic than yesterday but reported earlier this morning he felt sad for normal reason but now is feeling somewhat better.  Patient reported his goal was ways to deal with his self-hatred and also reports yesterday and the group activity he learned about balancing his life.  Patient stated that he learned about this is some staff will need to cut down some stuff he need to get rid of it some stuff he need to add to his life to have a balanced life.  Patient has no family visits yesterday and stated he likes to play a lot of video games which he want to cut down.  Patient reported he slept 5 hours last night because he slept several hours during the day time.  Patient reported his appetite has been getting better since admitted to the hospital.  Patient rated his a depression as 5 out of 10, anxiety 7 out of 10, anger 1 out of 10, 10 being the highest severity. Patient has been actively participating in therapeutic milieu, group activities and learning coping skills to control emotional difficulties including depression and anxiety.  The patient has no reported irritability, agitation or aggressive behavior. Patient has  been taking medication, tolerating well without side effects of the medication including GI upset or mood activation.    Principal Problem: Severe recurrent major depression without psychotic features (HCC) Diagnosis: Principal Problem:   Severe recurrent major depression without psychotic features (HCC) Active Problems:   Suicide ideation  Total Time spent with patient: 30 minutes  Past Psychiatric History: Major depressive disorder, OCD and multiple psychosocial stressors.  Patient has no previous psychiatric hospitalization but receiving outpatient medication management from the outpatient [facility].  Past Medical History:  Past Medical History:  Diagnosis Date  . Abdominal migraine   . Medical history non-contributory     Past Surgical History:  Procedure Laterality Date  . WISDOM TOOTH EXTRACTION     Family History:  Family History  Problem Relation Age of Onset  . ADD / ADHD Brother    Family Psychiatric  History: Family history of for depression on mother's side of the family As well as father's side of the family.  Patient younger brother was diagnosed ADHD.  Social History: Patient has been primarily staying with his father last 1 and half years since her mother and father has separated due to marital conflict and also patient prefers the more personal space at dad's home and also asked to play video games but reportedly does not get along with the father.  Patient has been behind in his schoolwork reportedly successfully completed only ninth grade even though he is currently working in 11th grade.  Patient was stressed about his mother's ask  him to take high school he could not examination. Social History   Substance and Sexual Activity  Alcohol Use No     Social History   Substance and Sexual Activity  Drug Use No    Social History   Socioeconomic History  . Marital status: Single    Spouse name: Not on file  . Number of children: 0  . Years of education: Not  on file  . Highest education level: 12th grade  Occupational History  . Not on file  Social Needs  . Financial resource strain: Not hard at all  . Food insecurity    Worry: Never true    Inability: Never true  . Transportation needs    Medical: Yes    Non-medical: Yes  Tobacco Use  . Smoking status: Never Smoker  . Smokeless tobacco: Never Used  Substance and Sexual Activity  . Alcohol use: No  . Drug use: No  . Sexual activity: Not Currently  Lifestyle  . Physical activity    Days per week: 0 days    Minutes per session: 0 min  . Stress: Very much  Relationships  . Social Herbalist on phone: Not on file    Gets together: Not on file    Attends religious service: Never    Active member of club or organization: Yes    Attends meetings of clubs or organizations: 1 to 4 times per year    Relationship status: Never married  Other Topics Concern  . Not on file  Social History Narrative  . Not on file   Additional Social History:    Pain Medications: Denies abuse Prescriptions: Denies abuse Over the Counter: Denies abuse History of alcohol / drug use?: No history of alcohol / drug abuse Longest period of sobriety (when/how long): NA                    Sleep: Fair  Appetite:  Fair  Current Medications: Current Facility-Administered Medications  Medication Dose Route Frequency Provider Last Rate Last Dose  . alum & mag hydroxide-simeth (MAALOX/MYLANTA) 200-200-20 MG/5ML suspension 30 mL  30 mL Oral Q6H PRN Lindon Romp A, NP      . FLUoxetine (PROZAC) capsule 40 mg  40 mg Oral Daily Ambrose Finland, MD   40 mg at 06/29/19 0831  . hydrOXYzine (ATARAX/VISTARIL) tablet 25 mg  25 mg Oral QHS PRN Lindon Romp A, NP      . magnesium hydroxide (MILK OF MAGNESIA) suspension 15 mL  15 mL Oral QHS PRN Rozetta Nunnery, NP        Lab Results:  Results for orders placed or performed during the hospital encounter of 06/27/19 (from the past 48 hour(s))   SARS Coronavirus 2 [facility] order, Performed in [facility] hospital lab) Nasopharyngeal Nasopharyngeal Swab     Status: None   Collection Time: 06/27/19  9:31 PM   Specimen: Nasopharyngeal Swab  Result Value Ref Range   SARS Coronavirus 2 NEGATIVE NEGATIVE    Comment: (NOTE) If result is NEGATIVE SARS-CoV-2 target nucleic acids are NOT DETECTED. The SARS-CoV-2 RNA is generally detectable in upper and lower  respiratory specimens during the acute phase of infection. The lowest  concentration of SARS-CoV-2 viral copies this assay can detect is 250  copies / mL. A negative result does not preclude SARS-CoV-2 infection  and should not be used as the sole basis for treatment or other  patient management decisions.  A negative  result may occur with  improper specimen collection / handling, submission of specimen other  than nasopharyngeal swab, presence of viral mutation(s) within the  areas targeted by this assay, and inadequate number of viral copies  (<250 copies / mL). A negative result must be combined with clinical  observations, patient history, and epidemiological information. If result is POSITIVE SARS-CoV-2 target nucleic acids are DETECTED. The SARS-CoV-2 RNA is generally detectable in upper and lower  respiratory specimens dur ing the acute phase of infection.  Positive  results are indicative of active infection with SARS-CoV-2.  Clinical  correlation with patient history and other diagnostic information is  necessary to determine patient infection status.  Positive results do  not rule out bacterial infection or co-infection with other viruses. If result is PRESUMPTIVE POSTIVE SARS-CoV-2 nucleic acids MAY BE PRESENT.   A presumptive positive result was obtained on the submitted specimen  and confirmed on repeat testing.  While 2019 novel coronavirus  (SARS-CoV-2) nucleic acids may be present in the submitted sample  additional confirmatory testing may be necessary for  epidemiological  and / or clinical management purposes  to differentiate between  SARS-CoV-2 and other Sarbecovirus currently known to infect humans.  If clinically indicated additional testing with an alternate test  methodology [phone]([id number]) is advised. The SARS-CoV-2 RNA is generally  detectable in upper and lower respiratory sp ecimens during the acute  phase of infection. The expected result is Negative. Fact Sheet for Patients:  BoilerBrush.com.cyhttps://www.fda.gov/media/136312/download Fact Sheet for Healthcare Providers: https://pope.com/https://www.fda.gov/media/136313/download This test is not yet approved or cleared by the [place] FDA and has been authorized for detection and/or diagnosis of SARS-CoV-2 by FDA under an Emergency Use Authorization (EUA).  This EUA will remain in effect (meaning this test can be used) for the duration of the COVID-19 declaration under Section 564(b)(1) of the Act, 21 U.S.C. section 360bbb-3(b)(1), unless the authorization is terminated or revoked sooner. Performed at [facility], [address] [place], [place] [address]   Hemoglobin A1c     Status: None   Collection Time: 06/28/19  6:30 AM  Result Value Ref Range   Hgb A1c MFr Bld 5.3 4.8 - 5.6 %    Comment: (NOTE) Pre diabetes:          5.7%-6.4% Diabetes:              >6.4% Glycemic control for   <7.0% adults with diabetes    Mean Plasma Glucose 105.41 mg/dL    Comment: Performed at [facility], [address] [place], [place] [address]  Lipid panel     Status: Abnormal   Collection Time: 06/28/19  6:30 AM  Result Value Ref Range   Cholesterol 197 (H) 0 - 169 mg/dL   Triglycerides 87 <478<150 mg/dL   HDL 29 (L) >29>40 mg/dL   Total CHOL/HDL Ratio 6.8 RATIO   VLDL 17 0 - 40 mg/dL   LDL Cholesterol 562151 (H) 0 - 99 mg/dL    Comment:        Total Cholesterol/HDL:CHD Risk Coronary Heart Disease Risk Table                     Men   Women  1/2 Average Risk   3.4   3.3  Average Risk       5.0    4.4  2 X Average Risk   9.6   7.1  3 X Average Risk  23.4   11.0  Use the calculated Patient Ratio above and the CHD Risk Table to determine the patient's CHD Risk.        ATP III CLASSIFICATION (LDL):  <100     mg/dL   Optimal  161-096  mg/dL   Near or Above                    Optimal  130-159  mg/dL   Borderline  045-409  mg/dL   High  >811     mg/dL   Very High Performed at [facility], [address], [place], [place] [address]   Comprehensive metabolic panel     Status: None   Collection Time: 06/28/19  6:30 AM  Result Value Ref Range   Sodium 141 135 - 145 mmol/L   Potassium 4.1 3.5 - 5.1 mmol/L   Chloride 104 98 - 111 mmol/L   CO2 26 22 - 32 mmol/L   Glucose, Bld 91 70 - 99 mg/dL   BUN 12 6 - 20 mg/dL   Creatinine, Ser 2.95 0.61 - 1.24 mg/dL   Calcium 9.1 8.9 - 62.1 mg/dL   Total Protein 7.8 6.5 - 8.1 g/dL   Albumin 4.3 3.5 - 5.0 g/dL   AST 19 15 - 41 U/L   ALT 30 0 - 44 U/L   Alkaline Phosphatase 94 38 - 126 U/L   Total Bilirubin 0.7 0.3 - 1.2 mg/dL   GFR calc non Af Amer >60 >60 mL/min   GFR calc Af Amer >60 >60 mL/min   Anion gap 11 5 - 15    Comment: Performed at [facility], [address], [place], [place] [address]  Prolactin     Status: Abnormal   Collection Time: 06/28/19  6:30 AM  Result Value Ref Range   Prolactin 17.5 (H) 4.0 - 15.2 ng/mL    Comment: (NOTE) Performed At: [facility] [address] [place], [place] [id number] Jolene Schimke MD MW:[id number]     Blood Alcohol level:  No results found for: [facility]  Metabolic Disorder Labs: Lab Results  Component Value Date   HGBA1C 5.3 06/28/2019   MPG 105.41 06/28/2019   Lab Results  Component Value Date   PROLACTIN 17.5 (H) 06/28/2019   Lab Results  Component Value Date   CHOL 197 (H) 06/28/2019   TRIG 87 06/28/2019   HDL 29 (L) 06/28/2019   CHOLHDL 6.8 06/28/2019   VLDL 17 06/28/2019   LDLCALC 151 (H) 06/28/2019    Physical  Findings: AIMS: Facial and Oral Movements Muscles of Facial Expression: None, normal Lips and Perioral Area: None, normal Jaw: None, normal Tongue: None, normal,Extremity Movements Upper (arms, wrists, hands, fingers): None, normal Lower (legs, knees, ankles, toes): None, normal, Trunk Movements Neck, shoulders, hips: None, normal, Overall Severity Severity of abnormal movements (highest score from questions above): None, normal Incapacitation due to abnormal movements: None, normal Patient's awareness of abnormal movements (rate only patient's report): No Awareness, Dental Status Current problems with teeth and/or dentures?: No Does patient usually wear dentures?: No  CIWA:    COWS:     Musculoskeletal: Strength & Muscle Tone: within normal limits Gait & Station: normal Patient leans: N/A  Psychiatric Specialty Exam: Physical Exam  ROS  Blood pressure 117/68, pulse 79, temperature 98.5 F (36.9 C), temperature source Oral, resp. rate 16, height 5' 4.57" (1.64 m), weight 93.5 kg, SpO2 98 %.Body mass index is 34.76 kg/m.  General Appearance: Guarded  Eye Contact:  Fair  Speech:  Slow  Volume:  Decreased  Mood:  Anxious, Depressed, Hopeless and Worthless  Affect:  Depressed and Flat  Thought Process:  Coherent, Goal Directed and Descriptions of Associations: Intact  Orientation:  Full (Time, Place, and Person)  Thought Content:  Rumination  Suicidal Thoughts:  Yes.  with intent/plan  Homicidal Thoughts:  No  Memory:  Immediate;   Fair Recent;   Fair Remote;   Fair  Judgement:  Impaired  Insight:  Fair  Psychomotor Activity:  Decreased  Concentration:  Concentration: Fair and Attention Span: Fair  Recall:  Good  Fund of Knowledge:  Good  Language:  Good  Akathisia:  Negative  Handed:  Right  AIMS (if indicated):     Assets:  Communication Skills Desire for Improvement Financial Resources/Insurance Housing Leisure Time Physical Health Resilience Social  Support Talents/Skills Transportation Vocational/Educational  ADL's:  Intact  Cognition:  WNL  Sleep:        Treatment Plan Summary: Daily contact with patient to assess and evaluate symptoms and progress in treatment and Medication management 1. Will maintain Q 15 minutes observation for safety. Estimated LOS: 5-7 days 2. Reviewed admission labs: CMP-normal, CBC-normal, lipids-cholesterol 197, HDL 29 and LDL 151, prolactin 17.5, hemoglobin A1c 5.3, TSH 1.930 and SARS coronavirus 2 testing-negative 3. Patient will participate in group, milieu, and family therapy. Psychotherapy: Social and Doctor, hospitalcommunication skill training, anti-bullying, learning based strategies, cognitive behavioral, and family object relations individuation separation intervention psychotherapies can be considered.  4. Depression: not improving; monitor response to titrated dose of fluoxetine 40 mg daily for depression.  5. OCD: Not improving; monitor response to fluoxetine 40 mg daily 6. Anxiety/insomnia: Monitor response to hydroxyzine 25 mg at bedtime as needed for anxiety and insomnia.  7. Will continue to monitor patient's mood and behavior. 8. Social Work will schedule a Family meeting to obtain collateral information and discuss discharge and follow up plan.  9. Discharge concerns will also be addressed: Safety, stabilization, and access to medication. 10. Expected date of discharge July 02, 2019  Leata MouseJonnalagadda Khristina Janota, MD 06/29/2019, 1:15 PM

## 2019-06-29 NOTE — Progress Notes (Signed)
Recreation Therapy Notes  Date: 06/29/2019 Time: 10:30-11:00 am Location: Courtyard       Group Topic/Focus: General Recreation   Goal Area(s) Addresses:  Patient will use appropriate interactions in play with peers.   Patient will follow directions on first prompt.  Behavioral Response: Appropriate   Intervention: Play and Exercise  Activity :  30 minutes of an Organized Game  Clinical Observations/Feedback: Patient with peers allowed 30 minutes of free play during recreation therapy group session today. Patient played appropriately with peers, demonstrated no aggressive behavior or other behavioral issues. Patients were instructed on the benefits of exercise and how often and for how long for a healthy lifestyle.    Tomi Likens, LRT/CTRS          Foy Vanduyne Milus Mallick 06/29/2019 12:33 PM

## 2019-06-29 NOTE — Progress Notes (Signed)
D: Patient presents with anxious affect, depressed mood. Patient interacts briefly with this Probation officer and peers and at times has delayed responses to questions asked. Patient identified goal for the day is to "come up with a plan to manage stress". Patient reports that his appetite has been improving, reports "fair" sleep, and denies any physical complaints. Patietn rates his day "6" (0-10).   A: Routine safety checks conducted every 15 minutes per unit protocol. Encouraged to notify if thoughts of harm toward self or others arise. Patient agrees.   R: Patient remains safe at this time, verbally contracting for safety. Will continue to monitor.   [place] NOVEL CORONAVIRUS (COVID-19) DAILY CHECK-OFF SYMPTOMS - answer yes or no to each - every day NO YES  Have you had a fever in the past 24 hours?  . Fever (Temp > 37.80C / 100F) X   Have you had any of these symptoms in the past 24 hours? . New Cough .  Sore Throat  .  Shortness of Breath .  Difficulty Breathing .  Unexplained Body Aches   X   Have you had any one of these symptoms in the past 24 hours not related to allergies?   . Runny Nose .  Nasal Congestion .  Sneezing   X   If you have had runny nose, nasal congestion, sneezing in the past 24 hours, has it worsened?  X   EXPOSURES - check yes or no X   Have you traveled outside the state in the past 14 days?  X   Have you been in contact with someone with a confirmed diagnosis of COVID-19 or PUI in the past 14 days without wearing appropriate PPE?  X   Have you been living in the same home as a person with confirmed diagnosis of COVID-19 or a PUI (household contact)?    X   Have you been diagnosed with COVID-19?    X              What to do next: Answered NO to all: Answered YES to anything:   Proceed with unit schedule Follow the [facility] Inpatient Flowsheet.

## 2019-06-30 NOTE — Progress Notes (Signed)
[facility] MD Progress Note  06/30/2019 10:56 AM Richard Raymond  MRN:  [id number] Subjective:  "I was able to sleep well and my mom surprised me with a visit yesterday."  Patient seen by this MD, chart reviewed and case discussed with treatment team.  In brief: Richard Raymond an 18 y.o.maleadmitted to [facility] for worsening anxiety, depression and suicidal ideation.  Patient has recurrent suicidal ideation related to self-hatred.   On evaluation the patient reported: Patient appeared with depressed mood and his affect is less constricted today, somewhat brighten upon approach and stated that sleeping beter last night helped him feel better.  Patient has delayed verbal responses to questions, but was more talkative today.  Patient reports he was able to sleep from 7:30pm last night until 4am this morning and is feeling more energetic. His mood was lifted by his mother visiting yesterday, which was surprising to him. They talked about what his living situation will be when he goes home and how the family is doing. Yesterday he thought of coping mechanisms to include listening to music, making music, playing the piano, drawing, doing yoga, showering, and cuddling with Mom or his brother. He reports low self-esteem and believes accomplishing some of his bigger goals will improve that. He reports coming up with phrases and words to tell himself when he feels self-deprecating. [name] states his appetite and sleep have improves since coming admission. Patient rated depression as 4 out of 10, anxiety 5 out of 10, anger 1 out of 10, 10 being the highest severity. He denies suicidal ideation, homicidal ideation, and audio or visual hallucinations. He states he is able to imagine drawings or pictures on the wall when he wants to. Patient reports enjoying a conversation with a nurse about computers.  Patient has been taking medication, tolerating well without side effects of the medication including GI upset or mood  activation.    Principal Problem: Severe recurrent major depression without psychotic features ([place]) Diagnosis: Principal Problem:   Severe recurrent major depression without psychotic features ([place]) Active Problems:   Suicide ideation  Total Time spent with patient: 30 minutes  Past Psychiatric History: Major depressive disorder, OCD and multiple psychosocial stressors.  Patient has no previous psychiatric hospitalization but receiving outpatient medication management from the outpatient at [facility].  Past Medical History:  Past Medical History:  Diagnosis Date  . Abdominal migraine   . Medical history non-contributory     Past Surgical History:  Procedure Laterality Date  . WISDOM TOOTH EXTRACTION     Family History:  Family History  Problem Relation Age of Onset  . ADD / ADHD Brother    Family Psychiatric  History: Family history of for depression on mother's side of the family As well as father's side of the family.  Patient younger brother was diagnosed ADHD.   Social History: Patient has been primarily staying with his father last 72 and half years since her mother and father has separated due to marital conflict and also patient prefers the more personal space at dad's home and asked to play video games but reportedly does not get along with the father.  Patient has been behind in his schoolwork reportedly successfully completed only ninth grade even though he is currently working on 11th grade.  Patient was stressed about his mother asks him to take high school equivalent examination.  Social History   Substance and Sexual Activity  Alcohol Use No     Social History   Substance and Sexual Activity  Drug Use No    Social History   Socioeconomic History  . Marital status: Single    Spouse name: Not on file  . Number of children: 0  . Years of education: Not on file  . Highest education level: 12th grade  Occupational History  . Not on file  Social Needs  .  Financial resource strain: Not hard at all  . Food insecurity    Worry: Never true    Inability: Never true  . Transportation needs    Medical: Yes    Non-medical: Yes  Tobacco Use  . Smoking status: Never Smoker  . Smokeless tobacco: Never Used  Substance and Sexual Activity  . Alcohol use: No  . Drug use: No  . Sexual activity: Not Currently  Lifestyle  . Physical activity    Days per week: 0 days    Minutes per session: 0 min  . Stress: Very much  Relationships  . Social Musicianconnections    Talks on phone: Not on file    Gets together: Not on file    Attends religious service: Never    Active member of club or organization: Yes    Attends meetings of clubs or organizations: 1 to 4 times per year    Relationship status: Never married  Other Topics Concern  . Not on file  Social History Narrative  . Not on file   Additional Social History:    Pain Medications: Denies abuse Prescriptions: Denies abuse Over the Counter: Denies abuse History of alcohol / drug use?: No history of alcohol / drug abuse Longest period of sobriety (when/how long): NA                    Sleep: Good  Appetite:  Good  Current Medications: Current Facility-Administered Medications  Medication Dose Route Frequency Provider Last Rate Last Dose  . alum & mag hydroxide-simeth (MAALOX/MYLANTA) 200-200-20 MG/5ML suspension 30 mL  30 mL Oral Q6H PRN Nira ConnBerry, Jason A, NP      . FLUoxetine (PROZAC) capsule 40 mg  40 mg Oral Daily Leata MouseJonnalagadda, Benard Minturn, MD   40 mg at 06/30/19 0804  . hydrOXYzine (ATARAX/VISTARIL) tablet 25 mg  25 mg Oral QHS PRN Nira ConnBerry, Jason A, NP      . magnesium hydroxide (MILK OF MAGNESIA) suspension 15 mL  15 mL Oral QHS PRN Jackelyn PolingBerry, Jason A, NP        Lab Results:  No results found for this or any previous visit (from the past 48 hour(s)).  Blood Alcohol level:  No results found for: [facility]  Metabolic Disorder Labs: Lab Results  Component Value Date   HGBA1C 5.3  06/28/2019   MPG 105.41 06/28/2019   Lab Results  Component Value Date   PROLACTIN 17.5 (H) 06/28/2019   Lab Results  Component Value Date   CHOL 197 (H) 06/28/2019   TRIG 87 06/28/2019   HDL 29 (L) 06/28/2019   CHOLHDL 6.8 06/28/2019   VLDL 17 06/28/2019   LDLCALC 151 (H) 06/28/2019    Physical Findings: AIMS: Facial and Oral Movements Muscles of Facial Expression: None, normal Lips and Perioral Area: None, normal Jaw: None, normal Tongue: None, normal,Extremity Movements Upper (arms, wrists, hands, fingers): None, normal Lower (legs, knees, ankles, toes): None, normal, Trunk Movements Neck, shoulders, hips: None, normal, Overall Severity Severity of abnormal movements (highest score from questions above): None, normal Incapacitation due to abnormal movements: None, normal Patient's awareness of abnormal movements (rate only patient's report): No  Awareness, Dental Status Current problems with teeth and/or dentures?: No Does patient usually wear dentures?: No  CIWA:    COWS:     Musculoskeletal: Strength & Muscle Tone: within normal limits Gait & Station: normal Patient leans: N/A  Psychiatric Specialty Exam: Physical Exam  ROS  Blood pressure 134/65, pulse 77, temperature 98.6 F (37 C), temperature source Oral, resp. rate 17, height 5' 4.57" (1.64 m), weight 93.5 kg, SpO2 97 %.Body mass index is 34.76 kg/m.  General Appearance: Guarded  Eye Contact:  Fair  Speech:  Slow  Volume:  Decreased  Mood:  Anxious and Depressed - slight better today  Affect:  Depressed and Flat  Thought Process:  Coherent, Goal Directed and Descriptions of Associations: Intact  Orientation:  Full (Time, Place, and Person)  Thought Content:  Rumination  Suicidal Thoughts:  No  Homicidal Thoughts:  No  Memory:  Immediate;   Fair Recent;   Fair Remote;   Fair  Judgement:  Fair  Insight:  Fair  Psychomotor Activity:  Decreased  Concentration:  Concentration: Fair and Attention Span:  Fair  Recall:  Good  Fund of Knowledge:  Good  Language:  Good  Akathisia:  Negative  Handed:  Right  AIMS (if indicated):     Assets:  Communication Skills Desire for Improvement Financial Resources/Insurance Housing Leisure Time Physical Health Resilience Social Support Talents/Skills Transportation Vocational/Educational  ADL's:  Intact  Cognition:  WNL  Sleep:   Good     Treatment Plan Summary: Daily contact with patient to assess and evaluate symptoms and progress in treatment and Medication management 1. Will maintain Q 15 minutes observation for safety. Estimated LOS: 5-7 days 2. Reviewed admission labs: CMP-normal, CBC-normal, lipids-cholesterol 197, HDL 29 and LDL 151, prolactin 17.5, hemoglobin A1c 5.3, TSH 1.930 and SARS coronavirus 2 testing-negative 3. Patient will participate in group, milieu, and family therapy. Psychotherapy: Social and Doctor, hospitalcommunication skill training, anti-bullying, learning based strategies, cognitive behavioral, and family object relations individuation separation intervention psychotherapies can be considered.  4. Depression: mildly improving; monitor response to titrated dose of Ffluoxetine 40 mg daily for depression.  5. OCD: No reported changes; monitor response to Fluoxetine 40 mg daily 6. Anxiety/insomnia: Monitor response to hydroxyzine 25 mg at bedtime as needed for anxiety and insomnia.  7. Will continue to monitor patient's mood and behavior. 8. Social Work will schedule a Family meeting to obtain collateral information and discuss discharge and follow up plan.  9. Discharge concerns will also be addressed: Safety, stabilization, and access to medication. 10. Expected date of discharge July 02, 2019  Leata MouseJonnalagadda Deaundre [place], MD 06/30/2019, 10:56 AM

## 2019-06-30 NOTE — [facility] Group Notes (Signed)
[facility] LCSW Group Therapy Note  Date/Time:  06/30/2019 2:15 pm   Type of Therapy and Topic:  Group Therapy:  Overcoming Obstacles  Participation Level: Active   Description of Group:    In this group patients will be encouraged to explore what they see as obstacles to their own wellness and recovery. They will be guided to discuss their thoughts, feelings, and behaviors related to these obstacles. The group will process together ways to cope with barriers, with attention given to specific choices patients can make. Each patient will be challenged to identify changes they are motivated to make in order to overcome their obstacles. This group will be process-oriented, with patients participating in exploration of their own experiences as well as giving and receiving support and challenge from other group members.  Therapeutic Goals: 1. Patient will identify personal and current obstacles as they relate to admission. 2. Patient will identify barriers that currently interfere with their wellness or overcoming obstacles.  3. Patient will identify feelings, thought process and behaviors related to these barriers. 4. Patient will identify two changes they are willing to make to overcome these obstacles:    Summary of Patient Progress Group members participated in this activity by defining obstacles and exploring feelings related to obstacles. Group members discussed examples of positive and negative obstacles. Group members identified the obstacle they feel most related to their admission and processed what they could do to overcome and what motivates them to accomplish this goal. Pt presents with flat affect and depressed mood. During check ins he describes his mood as "energetic since I just woke up from a nap." He shares his biggest mental health obstacle with the group. This is "my self hatred." Two automatic thoughts connected to his obstacle are "I'm worthless and life is pointless." Emotions  caused by the obstacle are "defeated, worthless, useless, ugly and stupid." Two changes he is willing to make to overcome the obstacle are "fighting back my negativity instead of letting it permeate my life. Building up my self-esteem. I hate being a guy. I feel hideous, icky and awful as a guy. I'd rather be pretty than handsome." Barriers that impede progression are "my own negativity and my self loathing. Also, that I don't believe in myself." A positive reminder he can utilize on the journey to overcoming the obstacle is "I can and will build myself into something I can love."     Therapeutic Modalities:   Cognitive Behavioral Therapy Solution Focused Therapy Motivational Interviewing Relapse Prevention Therapy  Savior Himebaugh S Zaynah Chawla MSW, LCSWA  Yunis Voorheis S. [place], [place], MSW [facility]: Child and Adolescent  [phone]

## 2019-06-30 NOTE — Progress Notes (Signed)
[place] NOVEL CORONAVIRUS (COVID-19) DAILY CHECK-OFF SYMPTOMS - answer yes or no to each - every day NO YES  Have you had a fever in the past 24 hours?  . Fever (Temp > 37.80C / 100F) X   Have you had any of these symptoms in the past 24 hours? . New Cough .  Sore Throat  .  Shortness of Breath .  Difficulty Breathing .  Unexplained Body Aches   X   Have you had any one of these symptoms in the past 24 hours not related to allergies?   . Runny Nose .  Nasal Congestion .  Sneezing   X   If you have had runny nose, nasal congestion, sneezing in the past 24 hours, has it worsened?  X   EXPOSURES - check yes or no X   Have you traveled outside the state in the past 14 days?  X   Have you been in contact with someone with a confirmed diagnosis of COVID-19 or PUI in the past 14 days without wearing appropriate PPE?  X   Have you been living in the same home as a person with confirmed diagnosis of COVID-19 or a PUI (household contact)?    X   Have you been diagnosed with COVID-19?    X              What to do next: Answered NO to all: Answered YES to anything:   Proceed with unit schedule Follow the [facility] Inpatient Flowsheet.   

## 2019-06-30 NOTE — Progress Notes (Signed)
Nursing Note: 0700-1900  D:  Pt presents with depressed/anxious mood and blunted affect.  Goal for today: List 10 ways to build self esteem.  "I truly hate being alive, so now I have to work on managing my self hatred." Pt asked how this hospitalization has been going so far, "Well it's been insightful and boring."  Pt answers all questions with a delayed response." He reports that his relationship with family is improving and that he is feeling better about himself. He slept well last night and appetite is improving.  A:  Encouraged to verbalize needs and concerns, active listening and support provided.  Continued Q 15 minute safety checks.    R:  Pt. is calm and cooperative.  Denies current A/V hallucinations and is able to verbally contract for safety.

## 2019-06-30 NOTE — [facility] Counselor (Signed)
CSW spoke with pt's mother to complete SPE and discuss discharge plan/process. During SPE, mother verbalized understanding and will make necessary changes. Mother will pick pt up on 07/02/19 at 1 PM.   Richard Mort S. [place], [place], MSW [facility]: Child and Adolescent  [phone]

## 2019-06-30 NOTE — Progress Notes (Signed)
Recreation Therapy Notes  Date: 06/30/2019 Time: 10:30-11:00 am Location: Courtyard       Group Topic/Focus: General Recreation   Goal Area(s) Addresses:  Patient will use appropriate interactions in play with peers.   Patient will follow directions on first prompt.  Behavioral Response: Appropriate   Intervention: Play and Exercise  Activity :  30 minutes of exercise  Clinical Observations/Feedback: Patient with peers allowed 30 minutes of free play during recreation therapy group session today. Patient played appropriately with peers, demonstrated no aggressive behavior or other behavioral issues. Patients were instructed on the benefits of exercise and how often and for how long for a healthy lifestyle.    Tomi Likens, LRT/CTRS          Davionte Lusby L Grayland Daisey 06/30/2019 2:34 PM

## 2019-07-01 MED ORDER — FLUOXETINE HCL 40 MG PO CAPS: 40.0000 mg | ORAL_CAPSULE | Freq: Every day | ORAL | 0 refills | Status: DC

## 2019-07-01 MED ORDER — HYDROXYZINE HCL 25 MG PO TABS: 25.0000 mg | ORAL_TABLET | Freq: Every evening | ORAL | 0 refills | Status: DC | PRN

## 2019-07-01 NOTE — Progress Notes (Signed)
[facility] MD Progress Note  07/01/2019 11:52 AM Richard Raymond  MRN:  [id number] Subjective:  "I learned and I have been hanging onto my self hatred for too long."  Patient seen by this MD, chart reviewed and case discussed with treatment team.  In brief: Richard Raymond an 18 y.o.maleadmitted to [facility] for worsening anxiety, depression and suicidal ideation.  Patient has recurrent suicidal ideation related to self-hatred.   On evaluation: Patient appeared with better energy nand mood this morning except few moment of sadness which does not have triggers. He stated that eating a breakfast made him feel better. He only ate rice last night due to options being too acidic for his stomach.  Patient reported his room temperature was not good for his sleep comfortably last evening/last night. He woke up in the middle of the night to adjust the A/C and was able to sleep well otherwise. Patient reports two episodes of left-hatred since last night, which he handled well with his coping mechanism: telling himself positive affirmations. His goal today is to make a list of 10 ways to build his self esteem. Patient's mother visited yesterday and they talked about who else in the family he can talk with about his problems. It is still undecided where he will go after discharge. He prefers to stay with his dad a few days, then move to his mom's house. Patient rated depression as 3 out of 10, anxiety 5 out of 10, anger 1 out of 10, 10 being the highest severity. He denies suicidal ideation, homicidal ideation, and audio or visual hallucinations. Patient has been on Fluoxetine 40mg  daily patient has been compliant with his medication, tolerating and has no specific adverse effects like a GI upset or mood activation but he does report moments of sadness without any triggers which she called himself mood swings.  Patient was educated there not mood swings and is continue to be adjusting to the medication which was changed couple of  days ago for better control of his mood and anxiety.  Denies current suicidal ideation, intention or plans.  Patient has no suicidal behaviors or gestures.  Patient has been compliant with the program and also contract for safety while being in the hospital.  Patient feels he is benefiting from this hospitalization and treatments he is getting here.    Principal Problem: Severe recurrent major depression without psychotic features ([place]) Diagnosis: Principal Problem:   Severe recurrent major depression without psychotic features ([place]) Active Problems:   Suicide ideation  Total Time spent with patient: 30 minutes  Past Psychiatric History: Major depressive disorder, OCD and multiple psychosocial stressors.  Patient has no previous psychiatric hospitalization but receiving outpatient medication management from the outpatient at [facility].  Past Medical History:  Past Medical History:  Diagnosis Date  . Abdominal migraine   . Medical history non-contributory     Past Surgical History:  Procedure Laterality Date  . WISDOM TOOTH EXTRACTION     Family History:  Family History  Problem Relation Age of Onset  . ADD / ADHD Brother    Family Psychiatric  History: Family history of for depression on mother's side of the family As well as father's side of the family.  Patient younger brother was diagnosed ADHD.   Social History: Patient has been primarily staying with his father last 3 and half years since her mother and father has separated due to marital conflict and also patient prefers the more personal space at dad's home and asked to  play video games but reportedly does not get along with the father.  Patient has been behind in his schoolwork reportedly successfully completed only ninth grade even though he is currently working on 11th grade.  Patient was stressed about his mother asks him to take high school equivalent examination.  Social History   Substance and Sexual Activity  Alcohol  Use No     Social History   Substance and Sexual Activity  Drug Use No    Social History   Socioeconomic History  . Marital status: Single    Spouse name: Not on file  . Number of children: 0  . Years of education: Not on file  . Highest education level: 12th grade  Occupational History  . Not on file  Social Needs  . Financial resource strain: Not hard at all  . Food insecurity    Worry: Never true    Inability: Never true  . Transportation needs    Medical: Yes    Non-medical: Yes  Tobacco Use  . Smoking status: Never Smoker  . Smokeless tobacco: Never Used  Substance and Sexual Activity  . Alcohol use: No  . Drug use: No  . Sexual activity: Not Currently  Lifestyle  . Physical activity    Days per week: 0 days    Minutes per session: 0 min  . Stress: Very much  Relationships  . Social Musicianconnections    Talks on phone: Not on file    Gets together: Not on file    Attends religious service: Never    Active member of club or organization: Yes    Attends meetings of clubs or organizations: 1 to 4 times per year    Relationship status: Never married  Other Topics Concern  . Not on file  Social History Narrative  . Not on file   Additional Social History:    Pain Medications: Denies abuse Prescriptions: Denies abuse Over the Counter: Denies abuse History of alcohol / drug use?: No history of alcohol / drug abuse Longest period of sobriety (when/how long): NA                    Sleep: Good  Appetite:  Good  Current Medications: Current Facility-Administered Medications  Medication Dose Route Frequency Provider Last Rate Last Dose  . alum & mag hydroxide-simeth (MAALOX/MYLANTA) 200-200-20 MG/5ML suspension 30 mL  30 mL Oral Q6H PRN Nira ConnBerry, Jason A, NP      . FLUoxetine (PROZAC) capsule 40 mg  40 mg Oral Daily Leata MouseJonnalagadda, Dorr Perrot, MD   40 mg at 07/01/19 0755  . hydrOXYzine (ATARAX/VISTARIL) tablet 25 mg  25 mg Oral QHS PRN Nira ConnBerry, Jason A, NP       . magnesium hydroxide (MILK OF MAGNESIA) suspension 15 mL  15 mL Oral QHS PRN Jackelyn PolingBerry, Jason A, NP        Lab Results:  No results found for this or any previous visit (from the past 48 hour(s)).  Blood Alcohol level:  [facility] found [facility]: [facility]  Metabolic Disorder Labs: Lab Results  Component Value Date   HGBA1C 5.3 06/28/2019   MPG 105.41 06/28/2019   Lab Results  Component Value Date   PROLACTIN 17.5 (H) 06/28/2019   Lab Results  Component Value Date   CHOL 197 (H) 06/28/2019   TRIG 87 06/28/2019   HDL 29 (L) 06/28/2019   CHOLHDL 6.8 06/28/2019   VLDL 17 06/28/2019   LDLCALC 151 (H) 06/28/2019    Physical  Findings: AIMS: Facial and Oral Movements Muscles of Facial Expression: None, normal Lips and Perioral Area: None, normal Jaw: None, normal Tongue: None, normal,Extremity Movements Upper (arms, wrists, hands, fingers): None, normal Lower (legs, knees, ankles, toes): None, normal, Trunk Movements Neck, shoulders, hips: None, normal, Overall Severity Severity of abnormal movements (highest score from questions above): None, normal Incapacitation due to abnormal movements: None, normal Patient's awareness of abnormal movements (rate only patient's report): No Awareness, Dental Status Current problems with teeth and/or dentures?: No Does patient usually wear dentures?: No  CIWA:    COWS:     Musculoskeletal: Strength & Muscle Tone: within normal limits Gait & Station: normal Patient leans: N/A  Psychiatric Specialty Exam: Physical Exam  ROS  Blood pressure 112/67, pulse 68, temperature 98.5 F (36.9 C), temperature source Oral, resp. rate 17, height 5' 4.57" (1.64 m), weight 93.5 kg, SpO2 97 %.Body mass index is 34.76 kg/m.  General Appearance: Guarded  Eye Contact:  Fair  Speech:  Slow  Volume:  Decreased  Mood:  Anxious and Depressed - slight better today  Affect:  Depressed and Flat  Thought Process:  Coherent, Goal Directed and Descriptions of  Associations: Intact  Orientation:  Full (Time, Place, and Person)  Thought Content:  Rumination  Suicidal Thoughts:  No  Homicidal Thoughts:  No  Memory:  Immediate;   Fair Recent;   Fair Remote;   Fair  Judgement:  Fair  Insight:  Fair  Psychomotor Activity:  Decreased  Concentration:  Concentration: Fair and Attention Span: Fair  Recall:  Good  Fund of Knowledge:  Good  Language:  Good  Akathisia:  Negative  Handed:  Right  AIMS (if indicated):     Assets:  Communication Skills Desire for Improvement Financial Resources/Insurance Housing Leisure Time Physical Health Resilience Social Support Talents/Skills Transportation Vocational/Educational  ADL's:  Intact  Cognition:  WNL  Sleep:   Good     Treatment Plan Summary: Reviewed current treatment plan on 07/01/2019  Daily contact with patient to assess and evaluate symptoms and progress in treatment and Medication management 1. Will maintain Q 15 minutes observation for safety. Estimated LOS: 5-7 days 2. Reviewed admission labs: CMP-normal, CBC-normal, lipids-cholesterol 197, HDL 29 and LDL 151, prolactin 17.5, hemoglobin A1c 5.3, TSH 1.930 and SARS coronavirus 2 testing-negative 3. Patient will participate in group, milieu, and family therapy. Psychotherapy: Social and Doctor, hospitalcommunication skill training, anti-bullying, learning based strategies, cognitive behavioral, and family object relations individuation separation intervention psychotherapies can be considered.  4. Depression: mildly improving; monitor response to titrated dose of Ffluoxetine 40 mg daily for depression.  5. OCD: No reported changes; monitor response to Fluoxetine 40 mg daily 6. Anxiety/insomnia: Monitor response to hydroxyzine 25 mg at bedtime as needed for anxiety and insomnia.  7. Will continue to monitor patient's mood and behavior. 8. Social Work will schedule a Family meeting to obtain collateral information and discuss discharge and follow up  plan.  9. Discharge concerns will also be addressed: Safety, stabilization, and access to medication. 10. Expected date of discharge July 02, 2019  Leata MouseJonnalagadda Ebbie Sorenson, MD 07/01/2019, 11:52 AM

## 2019-07-01 NOTE — Progress Notes (Signed)
Patient ID: Richard Raymond, adult   DOB: Feb 16, 2001, 18 y.o.   MRN: [id number] D: Patient denies SI/HI and auditory and visual hallucinations. Patient has a depressed mood and affect. Set goal to complete suicide safety plan. Good appetite and fair sleep. Rated his day a 7.  A: Patient given emotional support from RN. Patient given medications per MD orders. Patient encouraged to attend groups and unit activities. Patient encouraged to come to staff with any questions or concerns.  R: Patient remains cooperative and appropriate. Will continue to monitor patient for safety.

## 2019-07-01 NOTE — Progress Notes (Signed)
Child/Adolescent Psychoeducational Group Note  Date:  07/01/2019 Time:  7:20 AM  Group Topic/Focus:  Goals Group:   The focus of this group is to help patients establish daily goals to achieve during treatment and discuss how the patient can incorporate goal setting into their daily lives to aide in recovery.  Participation Level:  Minimal  Participation Quality:  Appropriate  Affect:  Flat  Cognitive:  Alert  Insight:  Appropriate  Engagement in Group:  Limited  Modes of Intervention:  Activity, Clarification, Discussion, Education and Support  Additional Comments:  The pt was provided the Thursday workbook, "Ready, Set, Go ... Leisure in [place]" and encouraged to read the content and complete the exercises.  Pt completed the Self-Inventory and rated the day a 7.   Pt's goal is to work on his suicide plan.  Pt was observed during quiet time spending approximately 45 minutes in the shower.  He shared with this staff he was an introvert and liked being in his room. Pt was observed quiet and stayed to the side of discussion during the morning group.   Carolyne Littles F  MHT/LRT/CTRS 07/01/2019, 7:20 AM

## 2019-07-01 NOTE — [facility] Suicide Risk Assessment (Signed)
[facility] Discharge Suicide Risk Assessment   Principal Problem: Severe recurrent major depression without psychotic features [facility]) Discharge Diagnoses: Principal Problem:   Severe recurrent major depression without psychotic features ([place]) Active Problems:   Suicide ideation   Total Time spent with patient: 15 minutes  Musculoskeletal: Strength & Muscle Tone: within normal limits Gait & Station: normal Patient leans: N/A  Psychiatric Specialty Exam: ROS  Blood pressure 125/72, pulse 79, temperature 99.3 F (37.4 C), resp. rate 16, height 5' 4.57" (1.64 m), weight 93.5 kg, SpO2 97 %.Body mass index is 34.76 kg/m.  General Appearance: Fairly Groomed  Engineer, water::  Good  Speech:  Clear and Coherent, normal rate  Volume:  Normal  Mood:  Euthymic  Affect:  Full Range  Thought Process:  Goal Directed, Intact, Linear and Logical  Orientation:  Full (Time, Place, and Person)  Thought Content:  Denies any A/VH, no delusions elicited, no preoccupations or ruminations  Suicidal Thoughts:  No  Homicidal Thoughts:  No  Memory:  good  Judgement:  Fair  Insight:  Present  Psychomotor Activity:  Normal  Concentration:  Fair  Recall:  Good  Fund of Knowledge:Fair  Language: Good  Akathisia:  No  Handed:  Right  AIMS (if indicated):     Assets:  Communication Skills Desire for Improvement Financial Resources/Insurance Housing Physical Health Resilience Social Support Vocational/Educational  ADL's:  Intact  Cognition: WNL     Mental Status Per Nursing Assessment::   On Admission:  Suicidal ideation indicated by patient, Plan includes specific time, place, or method  Demographic Factors:  Male, Adolescent or young adult and Caucasian  Loss Factors: NA  Historical Factors: Impulsivity  Risk Reduction Factors:   Sense of responsibility to family, Religious beliefs about death, Living with another person, especially a relative, Positive social support, Positive therapeutic  relationship and Positive coping skills or problem solving skills  Continued Clinical Symptoms:  Severe Anxiety and/or Agitation Depression:   Recent sense of peace/wellbeing Obsessive-Compulsive Disorder Previous Psychiatric Diagnoses and Treatments  Cognitive Features That Contribute To Risk:  Polarized thinking    Suicide Risk:  Minimal: No identifiable suicidal ideation.  Patients presenting with no risk factors but with morbid ruminations; may be classified as minimal risk based on the severity of the depressive symptoms  Follow-up [place]. Go on 07/12/2019.   Why: Please attend therapy appointment with Elmyra Ricks at 3 PM.  Please attend medication management appointment with Dr. Jeani Sow on 07/19/19 at 2:30 PM.  Contact information: Address: [address], [place], [place] [address] [phone]          Plan Of Care/Follow-up recommendations:  Activity:  As tolerated Diet:  Regular  Ambrose Finland, MD 07/02/2019, 11:48 AM

## 2019-07-01 NOTE — Progress Notes (Signed)
Patient ID: Richard Raymond, adult   DOB: Mar 15, 2001, 18 y.o.   MRN: [id number]  [place] NOVEL CORONAVIRUS (COVID-19) DAILY CHECK-OFF SYMPTOMS - answer yes or no to each - every day NO YES  Have you had a fever in the past 24 hours?  . Fever (Temp > 37.80C / 100F) X   Have you had any of these symptoms in the past 24 hours? . New Cough .  Sore Throat  .  Shortness of Breath .  Difficulty Breathing .  Unexplained Body Aches   X   Have you had any one of these symptoms in the past 24 hours not related to allergies?   . Runny Nose .  Nasal Congestion .  Sneezing   X   If you have had runny nose, nasal congestion, sneezing in the past 24 hours, has it worsened?  X   EXPOSURES - check yes or no X   Have you traveled outside the state in the past 14 days?  X   Have you been in contact with someone with a confirmed diagnosis of COVID-19 or PUI in the past 14 days without wearing appropriate PPE?  X   Have you been living in the same home as a person with confirmed diagnosis of COVID-19 or a PUI (household contact)?    X   Have you been diagnosed with COVID-19?    X              What to do next: Answered NO to all: Answered YES to anything:   Proceed with unit schedule Follow the [facility] Inpatient Flowsheet.

## 2019-07-01 NOTE — Progress Notes (Signed)
Recreation Therapy Notes Date: 07/01/2019 Time: 10:45- 11:20 pm  Location: 600 hall  Group Topic: Communication, Team Building, Problem Solving, Healthy Support Systems  Goal Area(s) Addresses:  Patient will effectively work with peer towards shared goal.  Patient will identify skills used to make activity successful.  Patient will identify how skills used during activity can be used to reach post d/c goals.   Behavioral Response: appropriate  Intervention: Hands on Activity; Minefield  Activity: LRT instructed patients to create a grid on the floor using poly spots. Next the LRT made a guide of a pathway through the minefield. The patients had the objective to work their way through the Minefield on the correct path. The only person who knew the correct path was the LRT. Patients one by one were instructed to make their way thru the Minefield, and if they make a wrong move they are warned by the noise of "boom". If the patient heard "boom", they have to go to the back of the line and the next person has the opportunity to get through the path. The patients can not speak to each other when someone was on the minefield, but could speak before they stepped onto the field.  Each person had to make it across the field successfully.  LRT and patients debriefed on the importance of patience, communication, paying attention, asking peers for help, and problem solving.  Education: Education officer, community, Dentist, Healthy Support Systems  Education Outcome: Acknowledges education.   Clinical Observations/Feedback: Patient worked well with peers and had a active level of participation during the activity.    Tomi Likens, LRT/CTRS         Kalya Troeger L Trinity Haun 07/01/2019 12:41 PM

## 2019-07-01 NOTE — Discharge Summary (Signed)
Physician Discharge Summary Note  Patient:  Richard Raymond is an 18 y.o., adult MRN:  [id number] DOB:  December 16, 2000 Patient phone:  [phone] (home)  Patient address:   [address] [place] [address],  Total Time spent with patient: 30 minutes  Date of Admission:  06/27/2019 Date of Discharge: 07/02/2019  Reason for Admission:  Richard Raymond is 18 years old male to male transition reportedly stage of contemplation, depression, OCD and suicidal ideation admitted to the behavioral health Hospital.   Patient reported he has been depressed and periodically suicidal for a while lately switching his into action of the suicidal thoughts due to excessive stress from the school and family. Reportedly patient has been in home schooling and try to get high school current examination/test because he did not complete his high school and did not get the diploma. Patient also reported parents has been separated for the last 1 year and is having hard time to cope with the separation. Patient reportedly has been receiving therapy from Dr. Elmyra Ricks and also seeking medication therapy from Dr. Elray Mcgregor [facility] outpatient psychiatric services. Reportedly has been taking 30 mg of Prozac for the last 3 months and hydroxyzine 25 mg 1/2 tablet at bedtime because whole tablet makes him knocked out. Patient reportedly has self-injurious behavior and reportedly had multiple superficial lacerations which does not required any suturing.  Principal Problem: Severe recurrent major depression without psychotic features [facility]) Discharge Diagnoses: Principal Problem:   Severe recurrent major depression without psychotic features ([place]) Active Problems:   Suicide ideation   Past Psychiatric History: Major depressive disorder recurrent, OCD and multiple psychosocial stressors.  Patient received outpatient medication management and counseling services from [facility] outpatient psychiatry.  Patient has been taking his medication Prozac and  hydroxyzine.  Patient has no previous acute psychiatric hospitalization.  Past Medical History:  Past Medical History:  Diagnosis Date  . Abdominal migraine   . Medical history non-contributory     Past Surgical History:  Procedure Laterality Date  . WISDOM TOOTH EXTRACTION     Family History:  Family History  Problem Relation Age of Onset  . ADD / ADHD Brother    Family Psychiatric  History: Unknown family history of mental illness. Social History:  Social History   Substance and Sexual Activity  Alcohol Use No     Social History   Substance and Sexual Activity  Drug Use No    Social History   Socioeconomic History  . Marital status: Single    Spouse name: Not on file  . Number of children: 0  . Years of education: Not on file  . Highest education level: 12th grade  Occupational History  . Not on file  Social Needs  . Financial resource strain: Not hard at all  . Food insecurity    Worry: Never true    Inability: Never true  . Transportation needs    Medical: Yes    Non-medical: Yes  Tobacco Use  . Smoking status: Never Smoker  . Smokeless tobacco: Never Used  Substance and Sexual Activity  . Alcohol use: No  . Drug use: No  . Sexual activity: Not Currently  Lifestyle  . Physical activity    Days per week: 0 days    Minutes per session: 0 min  . Stress: Very much  Relationships  . Social Herbalist on phone: Not on file    Gets together: Not on file    Attends religious service: Never  Active member of club or organization: Yes    Attends meetings of clubs or organizations: 1 to 4 times per year    Relationship status: Never married  Other Topics Concern  . Not on file  Social History Narrative  . Not on file    Hospital Course:   1. Patient was admitted to the Child and Adolescent  unit at [facility] under the service of Dr. Louretta Shorten. Safety: Placed in Q15 minutes observation for safety. During the course of  this hospitalization patient did not required any change on his observation and no PRN or time out was required.  No major behavioral problems reported during the hospitalization.  2. Routine labs reviewed: CMP-normal, CBC-normal, lipids-cholesterol 197, HDL 29 and LDL 151, prolactin 17.5, hemoglobin A1c 5.3, TSH 1.930 and SARS coronavirus 2 testing-negative. 3. An individualized treatment plan according to the patient's age, level of functioning, diagnostic considerations and acute behavior was initiated.  4. Preadmission medications, according to the guardian, consisted of fluoxetine 30 mg (20 mg +10 mg) daily and hydroxyzine 25 mg at bedtime as needed. 5. During this hospitalization he participated in all forms of therapy including  group, milieu, and family therapy.  Patient met with his psychiatrist on a daily basis and received full nursing service.  6. Due to long standing mood/behavioral symptoms the patient was started on titrated dose of fluoxetine 40 mg daily and hydroxyzine 25 mg at bedtime as needed for anxiety and insomnia.  Patient tolerated the above medication without adverse effects including GI upset or mood activation or suicidal thoughts.  Patient participated inpatient program, participated therapeutic group activities and milieu therapy and denies suicidal/homicidal ideation, intention or plans.  Patient has no evidence of psychotic symptoms.  Patient contract for safety throughout this hospitalization and also contract for safety at the time of discharge.  Patient treatment team has decided that patient is stable enough to be discharged to outpatient care with the resources available.  Patient will be discharged to mother's care with the follow-up plans.  Permission was granted from the guardian.  There were no major adverse effects from the medication.  7.  Patient was able to verbalize reasons for his  living and appears to have a positive outlook toward his future.  A safety plan  was discussed with him and his guardian.  He was provided with national suicide Hotline phone # 1-800-273-TALK as well as [facility]  number. 8.  Patient medically stable  and baseline physical exam within normal limits with no abnormal findings. 9. The patient appeared to benefit from the structure and consistency of the inpatient setting, continue current medication regimen and integrated therapies. During the hospitalization patient gradually improved as evidenced by: Denied suicidal ideation, homicidal ideation, psychosis, depressive symptoms subsided.   He displayed an overall improvement in mood, behavior and affect. He was more cooperative and responded positively to redirections and limits set by the staff. The patient was able to verbalize age appropriate coping methods for use at home and school. 10. At discharge conference was held during which findings, recommendations, safety plans and aftercare plan were discussed with the caregivers. Please refer to the therapist note for further information about issues discussed on family session. 11. On discharge patients denied psychotic symptoms, suicidal/homicidal ideation, intention or plan and there was no evidence of manic or depressive symptoms.  Patient was discharge home on stable condition   Physical Findings: AIMS: Facial and Oral Movements Muscles of Facial Expression:  None, normal Lips and Perioral Area: None, normal Jaw: None, normal Tongue: None, normal,Extremity Movements Upper (arms, wrists, hands, fingers): None, normal Lower (legs, knees, ankles, toes): None, normal, Trunk Movements Neck, shoulders, hips: None, normal, Overall Severity Severity of abnormal movements (highest score from questions above): None, normal Incapacitation due to abnormal movements: None, normal Patient's awareness of abnormal movements (rate only patient's report): No Awareness, Dental Status Current problems with teeth and/or  dentures?: No Does patient usually wear dentures?: No  CIWA:    COWS:      Psychiatric Specialty Exam: See EMD discharge SRA Physical Exam  ROS  Blood pressure 125/72, pulse 79, temperature 99.3 F (37.4 C), resp. rate 16, height 5' 4.57" (1.64 m), weight 93.5 kg, SpO2 97 %.Body mass index is 34.76 kg/m.  Sleep:           Has this patient used any form of tobacco in the last 30 days? (Cigarettes, Smokeless Tobacco, Cigars, and/or Pipes) Yes, No  Blood Alcohol level:  No results found for: [facility]  Metabolic Disorder Labs:  Lab Results  Component Value Date   HGBA1C 5.3 06/28/2019   MPG 105.41 06/28/2019   Lab Results  Component Value Date   PROLACTIN 17.5 (H) 06/28/2019   Lab Results  Component Value Date   CHOL 197 (H) 06/28/2019   TRIG 87 06/28/2019   HDL 29 (L) 06/28/2019   CHOLHDL 6.8 06/28/2019   VLDL 17 06/28/2019   LDLCALC 151 (H) 06/28/2019    See Psychiatric Specialty Exam and Suicide Risk Assessment completed by Attending Physician prior to discharge.  Discharge destination:  Home  Is patient on multiple antipsychotic therapies at discharge:  No   Has Patient had three or more failed trials of antipsychotic monotherapy by history:  No  Recommended Plan for Multiple Antipsychotic Therapies: NA  Discharge Instructions    Activity as tolerated - No restrictions   Complete by: As directed    Diet general   Complete by: As directed    Discharge instructions   Complete by: As directed    Discharge Recommendations:  The patient is being discharged with his family. Patient is to take his discharge medications as ordered.  See follow up above. We recommend that he participate in individual therapy to target depression, social anxiety and suicidal thoughts We recommend that he participate in  family therapy to target the conflict with his family, to improve communication skills and conflict resolution skills.  Family is to initiate/implement a contingency  based behavioral model to address patient's behavior. We recommend that he get AIMS scale, height, weight, blood pressure, fasting lipid panel, fasting blood sugar in three months from discharge as he's on atypical antipsychotics.  Patient will benefit from monitoring of recurrent suicidal ideation since patient is on antidepressant medication. The patient should abstain from all illicit substances and alcohol.  If the patient's symptoms worsen or do not continue to improve or if the patient becomes actively suicidal or homicidal then it is recommended that the patient return to the closest hospital emergency room or call 911 for further evaluation and treatment. National Suicide Prevention Lifeline 1800-SUICIDE or 5022575058. Please follow up with your primary medical doctor for all other medical needs.  The patient has been educated on the possible side effects to medications and he/his guardian is to contact a medical professional and inform outpatient provider of any new side effects of medication. He s to take regular diet and activity as tolerated.  Will benefit from moderate  daily exercise. Family was educated about removing/locking any firearms, medications or dangerous products from the home.     Allergies as of 07/02/2019      Reactions   Cefzil [cefprozil]    Diarrhea      Medication List    STOP taking these medications   FLUoxetine 10 MG tablet Commonly known as: PROZAC   FLUoxetine 20 MG tablet Commonly known as: PROZAC Replaced by: FLUoxetine 40 MG capsule     TAKE these medications     Indication  FLUoxetine 40 MG capsule Commonly known as: PROZAC Take 1 capsule (40 mg total) by mouth daily. Replaces: FLUoxetine 20 MG tablet  Indication: Generalized Anxiety Disorder, Major Depressive Disorder, Obsessive Compulsive Disorder, Social Anxiety Disorder   hydrOXYzine 25 MG tablet Commonly known as: ATARAX/VISTARIL Take 1 tablet (25 mg total) by mouth at bedtime as  needed for anxiety (sleep). What changed:   reasons to take this  additional instructions  Indication: Feeling Anxious, Insomnia      Follow-up Information    [facility]. Go on 07/12/2019.   Why: Please attend therapy appointment with Elmyra Ricks at 3 PM.  Please attend medication management appointment with Dr. Jeani Sow on 07/19/19 at 2:30 PM.  Contact information: Address: [place], [place], [place] [address] [phone]          Follow-up recommendations:  Activity:  As tolerated Diet:  Regular  Comments: Follow discharge instructions  Signed: Ambrose Finland, MD 07/02/2019, 11:48 AM

## 2019-07-01 NOTE — Progress Notes (Signed)
[facility] Child/Adolescent Case Management Discharge Plan :  Will you be returning to the same living situation after discharge: Yes,  Pt will return to parents Joe Mark and Wells Guiles Bell-[place]ohac care At discharge, do you have transportation home?:Yes,  Mother is picking pt up at 1 PM Do you have the ability to pay for your medications:Yes,  Christella Scheuermann- no barriers  Release of information consent forms completed and in the chart;  Patient's signature needed at discharge.  Patient to Follow up at: Follow-up Information    [facility]. Go on 07/12/2019.   Why: Please attend therapy appointment with Elmyra Ricks at 3 PM.  Please attend medication management appointment with Dr. Jeani Sow on 07/19/19 at 2:30 PM.  Contact information: Address: [place], [place], [place] [address] [phone]          Family Contact:  Telephone:  Spoke with:  CSW spoke with pt's mother  Land and Suicide Prevention discussed:  Yes,  CSW discussed with pt and mother  Discharge Family Session: Pt is 71 and will meet with discharging RN to review medications, AVS(aftercare appointments), ROIs and SPE.   Serafina Topham S Maynard David 07/01/2019, 5:12 PM   Herve Haug S. [place], [place], MSW [facility]: Child and Adolescent  [phone]

## 2019-07-02 NOTE — Progress Notes (Signed)
[place] NOVEL CORONAVIRUS (COVID-19) DAILY CHECK-OFF SYMPTOMS - answer yes or no to each - every day NO YES  Have you had a fever in the past 24 hours?  . Fever (Temp > 37.80C / 100F) X   Have you had any of these symptoms in the past 24 hours? . New Cough .  Sore Throat  .  Shortness of Breath .  Difficulty Breathing .  Unexplained Body Aches   X   Have you had any one of these symptoms in the past 24 hours not related to allergies?   . Runny Nose .  Nasal Congestion .  Sneezing   X   If you have had runny nose, nasal congestion, sneezing in the past 24 hours, has it worsened?  X   EXPOSURES - check yes or no X   Have you traveled outside the state in the past 14 days?  X   Have you been in contact with someone with a confirmed diagnosis of COVID-19 or PUI in the past 14 days without wearing appropriate PPE?  X   Have you been living in the same home as a person with confirmed diagnosis of COVID-19 or a PUI (household contact)?    X   Have you been diagnosed with COVID-19?    X              What to do next: Answered NO to all: Answered YES to anything:   Proceed with unit schedule Follow the [facility] Inpatient Flowsheet.   

## 2019-07-02 NOTE — Progress Notes (Signed)

## 2019-07-02 NOTE — Plan of Care (Signed)
Patient was attentive and well behaved during recreation therapy groups on the unit.

## 2019-07-02 NOTE — Progress Notes (Signed)
Recreation Therapy Notes   Date: 07/02/2019 Time: 10:30-11:30 am Location: 100 hall day room   Group Topic: Self-Esteem   Goal Area(s) Addresses:  Patient will work together on a group topic of self esteem.  Patient will create a list with their partner on positive and negative self esteem factors.  Patient will identify their definition of self esteem.  Patient will follow instructions on 1st prompt.    Behavioral Response: appropriate   Intervention/ Activity: Patient attended a recreation therapy group session focused around Self- Esteem. Patients and LRT discussed the importance of knowing how you feel about yourself regardless of what others say about them.Patients created a T- chart on positive and negative attributes that contributes to self esteem. Patients and LRT share their findings with the group and LRT encouraged the group to brainstorm how they can turn the negatives  to positive and overall have an increase of positive self esteem.   Education Outcome: Acknowledges education, Science writer understanding of Education   Comments: Patient worked well with others.   Tomi Likens, LRT/CTRS       Nargis Abrams L [place] 07/02/2019 12:56 PM

## 2019-07-02 NOTE — Progress Notes (Signed)
Child/Adolescent Psychoeducational Group Note  Date:  07/02/2019 Time:  12:00 PM  Group Topic/Focus:  Goals Group:   The focus of this group is to help patients establish daily goals to achieve during treatment and discuss how the patient can incorporate goal setting into their daily lives to aide in recovery.  Participation Level:  Minimal  Participation Quality:  Appropriate  Affect:  Appropriate  Cognitive:  Alert  Insight:  Good  Engagement in Group:  Engaged  Modes of Intervention:  Discussion  Additional Comments:  Pt goal for today was prepare for discharge. He rated her day 7/10. Pt mentioned in group how he is still continues to struggle with things.  [place] 07/02/2019, 12:00 PM

## 2019-07-09 ENCOUNTER — Telehealth: Payer: Self-pay | Admitting: Child and Adolescent Psychiatry

## 2019-07-09 NOTE — Telephone Encounter (Signed)
Letter requested to support mother's FMLA request sent.

## 2019-07-12 ENCOUNTER — Other Ambulatory Visit: Payer: Self-pay

## 2019-07-12 ENCOUNTER — Ambulatory Visit: Payer: 59 | Admitting: Licensed Clinical Social Worker

## 2019-07-15 ENCOUNTER — Other Ambulatory Visit: Payer: Self-pay

## 2019-07-15 ENCOUNTER — Ambulatory Visit (INDEPENDENT_AMBULATORY_CARE_PROVIDER_SITE_OTHER): Payer: 59 | Admitting: Licensed Clinical Social Worker

## 2019-07-15 DIAGNOSIS — F418 Other specified anxiety disorders: Secondary | ICD-10-CM

## 2019-07-15 DIAGNOSIS — F331 Major depressive disorder, recurrent, moderate: Secondary | ICD-10-CM

## 2019-07-16 ENCOUNTER — Encounter (HOSPITAL_COMMUNITY): Payer: Self-pay | Admitting: Licensed Clinical Social Worker

## 2019-07-16 NOTE — Progress Notes (Signed)
Comprehensive Clinical Assessment (CCA) Note  07/16/2019 Gordan PaymentBrysen Maland [id number]  Visit Diagnosis:      ICD-10-CM   1. Moderate episode of recurrent major depressive disorder (HCC)  F33.1   2. Other specified anxiety disorders  F41.8       CCA Part One  Part One has been completed on paper by the patient.  (See scanned document in Chart Review)  CCA Part Two A  Intake/Chief Complaint:  CCA Intake With Chief Complaint CCA Part Two Date: 07/15/19 CCA Part Two Time: 0824 Chief Complaint/Presenting Problem: Depression, SI, and Gender Patients Currently Reported Symptoms/Problems: Mood: sadness, difficulty staying asleep, difficulty with appetite, difficulty with concentratuion, lower energy, passive SI at times, low self esteem, gender issues Collateral Involvement: None Individual's Strengths: Analyze things, Individual's Preferences: Doesn't like to be the center of attention, prefers to be quiet at first, doesn't prefer loudmouthed people Individual's Abilities: basic knowledge of soccer, lots of knowledge of gaming, good memory, Type of Services Patient Feels Are Needed: Therapy, medication Initial Clinical Notes/Concerns: Symptoms started when he was 12 or 13 when his parents relationships started going down hill, symptoms occur 3-7 days a week, symptoms are moderate  Mental Health Symptoms Depression:  Depression: Change in energy/activity, Difficulty Concentrating, Worthlessness, Sleep (too much or little)  Mania:  Mania: N/A  Anxiety:   Anxiety: N/A  Psychosis:  Psychosis: N/A  Trauma:  Trauma: N/A  Obsessions:  Obsessions: N/A  Compulsions:  Compulsions: N/A  Inattention:  Inattention: N/A  Hyperactivity/Impulsivity:  Hyperactivity/Impulsivity: N/A  Oppositional/Defiant Behaviors:  Oppositional/Defiant Behaviors: N/A  Borderline Personality:  Emotional Irregularity: N/A  Other Mood/Personality Symptoms:  Other Mood/Personality Symtpoms: None   Mental Status  Exam Appearance and self-care  Stature:  Stature: Average  Weight:  Weight: Average weight  Clothing:  Clothing: Casual  Grooming:  Grooming: Normal  Cosmetic use:  Cosmetic Use: None  Posture/gait:  Posture/Gait: Normal  Motor activity:  Motor Activity: Not Remarkable  Sensorium  Attention:  Attention: Normal  Concentration:  Concentration: Normal  Orientation:  Orientation: X5  Recall/memory:  Recall/Memory: Normal  Affect and Mood  Affect:  Affect: Depressed  Mood:  Mood: Depressed  Relating  Eye contact:  Eye Contact: Normal  Facial expression:  Facial Expression: Responsive  Attitude toward examiner:  Attitude Toward Examiner: Cooperative  Thought and Language  Speech flow: Speech Flow: Normal  Thought content:  Thought Content: Appropriate to mood and circumstances  Preoccupation:  Preoccupations: (N/A)  Hallucinations:  Hallucinations: (N/A)  Organization:   Logical   Company secretaryxecutive Functions  Fund of Knowledge:  Fund of Knowledge: Average  Intelligence:  Intelligence: Average  Abstraction:  Abstraction: Normal  Judgement:  Judgement: Normal  Reality Testing:  Reality Testing: Adequate  Insight:  Insight: Good  Decision Making:  Decision Making: Normal  Social Functioning  Social Maturity:  Social Maturity: Responsible  Social Judgement:  Social Judgement: Normal  Stress  Stressors:  Stressors: Transitions  Coping Ability:  Coping Ability: Normal  Skill Deficits:   Family, Gender  Supports:   Family   Family and Psychosocial History: Family history Marital status: Single Are you sexually active?: No What is your sexual orientation?: "I currently identify as male and I intend to transition to male. I want to be a bit further along when I do that." Has your sexual activity been affected by drugs, alcohol, medication, or emotional stress?: N/A Does patient have children?: No  Childhood History:  Childhood History By whom was/is the patient raised?: Both  parents  Additional childhood history information: Patient describes childhood as "good" but doesn't remember a lot Description of patient's relationship with caregiver when they were a child: Mother: Pretty good   Father: Good("The environment was bad to the point that I only left my room when nobody else was out in the home because I did not want to deal with anyone fighting.") Patient's description of current relationship with people who raised him/her: Mother: Decent   Father: Ok How were you disciplined when you got in trouble as a child/adolescent?: Spankings and then taking away things Does patient have siblings?: Yes Number of Siblings: 2 Description of patient's current relationship with siblings: Brothers: Leo: Fairly good, Aythan: Loud mouth Did patient suffer any verbal/emotional/physical/sexual abuse as a child?: No Did patient suffer from severe childhood neglect?: No Has patient ever been sexually abused/assaulted/raped as an adolescent or adult?: No Was the patient ever a victim of a crime or a disaster?: No Witnessed domestic violence?: No Has patient been effected by domestic violence as an adult?: No  CCA Part Two B  Employment/Work Situation: Employment / Work Copywriter, advertising Employment situation: Radio broadcast assistant job has been impacted by current illness: No Describe how patient's job has been impacted: N/A What is the longest time patient has a held a job?: N/A Where was the patient employed at that time?: N/A Did You Receive Any Psychiatric Treatment/Services While in Passenger transport manager?: No Are There Guns or Other Weapons in [place]?: Yes("In my dad's house we do. We do not have ammunition, that and the guns are locked up to my knowledge. I would not be surprised if he did not lock them all up after my attempt.") Types of Guns/Weapons: Handguns, shot gun, rifles Are These Weapons Safely Secured?: Yes  Education: Education School Currently Attending: [employer] Last Grade Completed: 10 Name of High School: Homeschool Did Teacher, adult education From [employer]?: No Did You Nutritional therapist?: No Did [place]?: No Did You Have Any Special Interests In School?: Math, piano Did You Have An Individualized Education Program (IIEP): No Did You Have Any Difficulty At School?: No  Religion: Religion/Spirituality Are You A Religious Person?: Yes What is Your Religious Affiliation?: Unknown How Might This Affect Treatment?: Support  Leisure/Recreation: Leisure / Recreation Leisure and Hobbies: Video games, comics, reading ,drawing, composing music, playing piano and chatting  Exercise/Diet: Exercise/Diet Do You Exercise?: No Have You Gained or Lost A Significant Amount of Weight in the Past Six Months?: No Do You Follow a Special Diet?: No Do You Have Any Trouble Sleeping?: No  CCA Part Two C  Alcohol/Drug Use: Alcohol / Drug Use Pain Medications: Denies abuse Prescriptions: Denies abuse Over the Counter: Denies abuse History of alcohol / drug use?: No history of alcohol / drug abuse Longest period of sobriety (when/how long): NA                      CCA Part Three  ASAM's:  Six Dimensions of Multidimensional Assessment  Dimension 1:  Acute Intoxication and/or Withdrawal Potential:  Dimension 1:  Comments: None  Dimension 2:  Biomedical Conditions and Complications:  Dimension 2:  Comments: None  Dimension 3:  Emotional, Behavioral, or Cognitive Conditions and Complications:  Dimension 3:  Comments: None  Dimension 4:  Readiness to Change:  Dimension 4:  Comments: None  Dimension 5:  Relapse, Continued use, or Continued Problem Potential:  Dimension 5:  Comments: None  Dimension 6:  Recovery/Living Environment:  Dimension 6:  Recovery/Living Environment Comments: None   Substance use Disorder (SUD)    Social Function:  Social Functioning Social Maturity: Responsible Social Judgement: Normal  Stress:   Stress Stressors: Transitions Coping Ability: Normal Patient Takes Medications The Way The Doctor Instructed?: Yes Priority Risk: Low Acuity  Risk Assessment- Self-Harm Potential: Risk Assessment For Self-Harm Potential Thoughts of Self-Harm: Vague current thoughts Method: No plan Availability of Means: No access/NA  Risk Assessment -Dangerous to Others Potential: Risk Assessment For Dangerous to Others Potential Method: No Plan Availability of Means: No access or NA Intent: Vague intent or NA Notification Required: No need or identified person  DSM5 Diagnoses: Patient Active Problem List   Diagnosis Date Noted  . Suicide ideation 06/28/2019  . Severe recurrent major depression without psychotic features (HCC) 06/27/2019  . Other specified anxiety disorders 05/06/2019  . Mixed obsessional thoughts and acts 05/06/2019    Patient Centered Plan: Patient is on the following Treatment Plan(s):  Depression  Recommendations for Services/Supports/Treatments: Recommendations for Services/Supports/Treatments Recommendations For Services/Supports/Treatments: Individual Therapy, Medication Management  Treatment Plan Summary: OP Treatment Plan Summary: Gordan PaymentBrysen will improve mood as evidenced by managing feelings of depression and imrproving self esteem for 5 out of 7 days for 60 days.   Referrals to Alternative Service(s): Referred to Alternative Service(s):   Place:   Date:   Time:    Referred to Alternative Service(s):   Place:   Date:   Time:    Referred to Alternative Service(s):   Place:   Date:   Time:    Referred to Alternative Service(s):   Place:   Date:   Time:     Bynum BellowsJoshua Kamarri Lovvorn, LCSW

## 2019-07-19 ENCOUNTER — Encounter: Payer: Self-pay | Admitting: Child and Adolescent Psychiatry

## 2019-07-19 ENCOUNTER — Ambulatory Visit (INDEPENDENT_AMBULATORY_CARE_PROVIDER_SITE_OTHER): Payer: 59 | Admitting: Child and Adolescent Psychiatry

## 2019-07-19 ENCOUNTER — Other Ambulatory Visit: Payer: Self-pay

## 2019-07-19 DIAGNOSIS — F331 Major depressive disorder, recurrent, moderate: Secondary | ICD-10-CM | POA: Diagnosis not present

## 2019-07-19 DIAGNOSIS — F422 Mixed obsessional thoughts and acts: Secondary | ICD-10-CM | POA: Diagnosis not present

## 2019-07-19 DIAGNOSIS — F418 Other specified anxiety disorders: Secondary | ICD-10-CM | POA: Diagnosis not present

## 2019-07-19 MED ORDER — FLUOXETINE HCL 40 MG PO CAPS: 40.0000 mg | ORAL_CAPSULE | Freq: Every day | ORAL | 0 refills | Status: DC

## 2019-07-19 MED ORDER — HYDROXYZINE HCL 25 MG PO TABS: 25.0000 mg | ORAL_TABLET | Freq: Every evening | ORAL | 0 refills | Status: DC | PRN

## 2019-07-19 NOTE — Progress Notes (Signed)
Virtual Visit via Video Note  I connected with Richard Raymond on 07/19/19 at  2:30 PM EDT by a video enabled telemedicine application and verified that I am speaking with the correct person using two identifiers.  Location: Patient: Home Provider: Office   I discussed the limitations of evaluation and management by telemedicine and the availability of in person appointments. The patient expressed understanding and agreed to proceed.    I discussed the assessment and treatment plan with the patient. The patient was provided an opportunity to ask questions and all were answered. The patient agreed with the plan and demonstrated an understanding of the instructions.   The patient was advised to call back or seek an in-person evaluation if the symptoms worsen or if the condition fails to improve as anticipated.  I provided 30 minutes of non-face-to-face time during this encounter.   Richard SmallingHiren M Ashima Shrake, MD    [facility] MD/PA/NP OP Progress Note  07/19/2019 3:22 PM [name]  MRN:  [id number]  Chief Complaint: Medication management follow-up for depression, anxiety, OCD.  HPI: This is an 18 year old Caucasian, assigned male at birth(AMAB), currently identifying self as transgender male preferring she/her pronoun with psychiatric history significant of depression, anxiety and OCD was seen and evaluated for telemedicine encounter for medication management follow-up.  In the interim since the last visit [facility] in the context of worsening of depression and suicidal thoughts.  Her inpatient chart was reviewed prior to visit today.  Her Prozac was increased to 40 mg once a day from 30 mg once a day during the inpatient hospitalization.  During the visit Richard HackerBryson corroborates the history that led to her hospitalization as mentioned in the inpatient chart.  She reports that hospitalization but some insight into her struggles.  She reports that she continues to struggle with low self-esteem,  continues to have dislike about her appearance in the context of gender identity, and off when she gets into irrational overthinking that leads to "downward spiral".  She reports that she worked in the groups and have worked on Arts administratorworksheets including safety worksheets that she has been keeping and that has been helpful.  She reports that her therapy was transferred to [name] Raymond and she reports that that has been a significant positive change in regards of her therapy.  Since the discharge from the hospital she reports that she has been doing well, depression has been stable, continues to have intermittent passive suicidal thoughts but denies any active suicidal thoughts or intent or plan, sleeping well with hydroxyzine, eating well, anxiety has been stable, eating well.  She denies any new psychosocial stressors except that she is taking high school equivalency tests in the upcoming week or 2 that has increased some anxiety.  No other concerns expressed today.  We discussed to continue her Prozac at 40 mg daily and hydroxyzine as needed for sleep.  She verbalized understanding.  Visit Diagnosis:    ICD-10-CM   1. Other specified anxiety disorders  F41.8   2. Mixed obsessional thoughts and acts  F42.2   3. Moderate episode of recurrent major depressive disorder (HCC)  F33.1     Past Psychiatric History: As mentioned in initial H&P, reviewed today, [facility] at the beginning of August, Prozac was increased to 40 mg once a day.  She also started seeing [name] worksheets at risk for the office for therapy.  Past Medical History:  Past Medical History:  Diagnosis Date  . Abdominal migraine   . Medical history non-contributory     Past Surgical History:  Procedure Laterality Date  . WISDOM TOOTH EXTRACTION      Family Psychiatric History: As mentioned in initial H&P, reviewed today, no change  Family History:  Family History  Problem Relation Age of  Onset  . ADD / ADHD Brother     Social History:  Social History   Socioeconomic History  . Marital status: Single    Spouse name: Not on file  . Number of children: 0  . Years of education: Not on file  . Highest education level: 12th grade  Occupational History  . Not on file  Social Needs  . Financial resource strain: Not hard at all  . Food insecurity    Worry: Never true    Inability: Never true  . Transportation needs    Medical: Yes    Non-medical: Yes  Tobacco Use  . Smoking status: Never Smoker  . Smokeless tobacco: Never Used  Substance and Sexual Activity  . Alcohol use: No  . Drug use: No  . Sexual activity: Not Currently  Lifestyle  . Physical activity    Days per week: 0 days    Minutes per session: 0 min  . Stress: Very much  Relationships  . Social Herbalist on phone: Not on file    Gets together: Not on file    Attends religious service: Never    Active member of club or organization: Yes    Attends meetings of clubs or organizations: 1 to 4 times per year    Relationship status: Never married  Other Topics Concern  . Not on file  Social History Narrative  . Not on file    Allergies:  Allergies  Allergen Reactions  . Cefzil [Cefprozil]     Diarrhea    Metabolic Disorder Labs: Lab Results  Component Value Date   HGBA1C 5.3 06/28/2019   MPG 105.41 06/28/2019   Lab Results  Component Value Date   PROLACTIN 17.5 (H) 06/28/2019   Lab Results  Component Value Date   CHOL 197 (H) 06/28/2019   TRIG 87 06/28/2019   HDL 29 (L) 06/28/2019   CHOLHDL 6.8 06/28/2019   VLDL 17 06/28/2019   LDLCALC 151 (H) 06/28/2019   Lab Results  Component Value Date   TSH 1.930 06/22/2019    Therapeutic Level Labs: No results found for: LITHIUM No results found for: VALPROATE No components found for:  CBMZ  Current Medications: Current Outpatient Medications  Medication Sig Dispense Refill  . FLUoxetine (PROZAC) 40 MG capsule Take 1  capsule (40 mg total) by mouth daily. 30 capsule 0  . hydrOXYzine (ATARAX/VISTARIL) 25 MG tablet Take 1 tablet (25 mg total) by mouth at bedtime as needed for anxiety (sleep). 30 tablet 0   No current facility-administered medications for this visit.      Musculoskeletal: Strength & Muscle Tone: unable to assess since visit was over the telemedicine. Gait & Station: unable to assess since visit was over the telemedicine. Patient leans: N/A  Psychiatric Specialty Exam: ROSReview of 12 systems negative except as mentioned in HPI  Mental Status Exam: Appearance: casually dressed; well groomed; no overt signs of trauma or distress noted Attitude: calm, cooperative with good eye contact Activity: No PMA/PMR, no tics/no tremors; no EPS noted  Speech: Decreased rate, rhythm normal and volume normal Thought Process: Linear and concrete Associations: no looseness, tangentiality, circumstantiality, flight  of ideas, thought blocking or word salad noted Thought Content: (abnormal/psychotic thoughts): no abnormal or delusional thought process evidenced SI/HI: denies Si/Hi Perception: no illusions or visual/auditory hallucinations noted; no response to internal stimuli demonstrated Mood & Affect: "good"/affect was brighter at times as compare to previous visits Judgment & Insight: both fair Attention and Concentration : Good Cognition : WNL Language : Good ADL - Intact    Screenings: AIMS     [facility]Dis[facility]  AIMS Total Score  0    AUDIT     Admission (Discharged) from [facility]  Alcohol Use Disorder Identification Test Final Score (AUDIT)  0       Assessment and Plan:   # Depression (improving) - Continue Prozac 40 mg daily - Side effects including but not limited to nausea, vomiting, diarrhea, constipation, headaches, dizziness,  black box warning of suicidal thoughts with SSRI were discussed with pt. Pt provided informed consent.  - Therapy scheduled with Mr Raymond every other week.   - Labs from inpatient reviewed, CBC and CMP stable, Cholesterol 197 with HDL 29 and LDL 151; HbA1C 5.3 and TSH - WNL.    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  - Same as mentioned above.   # OCD (worse) - His reports are most consistent with OCD.  - Same as mentioned above.   # Asperger's syndrome (chronic) - Mother's report of some sensory issues, difficulties with transition when he was young, concreteness, concerning for Asperger's syndrome.   # Acute Intermittent porphyria(AIP) - Concerns for AIP. He has hx of abdominal migraine which he describes as intermittent episodes of uneasiness in stomach and moderate to severe abdominal pain accompanied with mild numbness in his body, bloating. His father has similar hx and perhaps worse attacks than him. Spoke with porphyria foundation representative previously on their physician consultation line and they recommended free genetic testing. Will follow up with PCP on this. Also spoke with PCP about the concern previous and he is referred to [facility].  # Gender Identity Concerns - at present would like to use she/her pronoun and name - Gordan PaymentBrysen - parents are aware and appears to be accepting.   Mother phone  - [phone]    Richard SmallingHiren M Truda Staub, MD  Richard SmallingHiren M Jushua Waltman, MD 07/19/2019, 3:22 PM

## 2019-08-09 ENCOUNTER — Ambulatory Visit (INDEPENDENT_AMBULATORY_CARE_PROVIDER_SITE_OTHER): Payer: 59 | Admitting: Licensed Clinical Social Worker

## 2019-08-09 ENCOUNTER — Other Ambulatory Visit: Payer: Self-pay

## 2019-08-09 DIAGNOSIS — F331 Major depressive disorder, recurrent, moderate: Secondary | ICD-10-CM | POA: Diagnosis not present

## 2019-08-10 ENCOUNTER — Encounter (HOSPITAL_COMMUNITY): Payer: Self-pay | Admitting: Licensed Clinical Social Worker

## 2019-08-10 NOTE — Progress Notes (Signed)
Virtual Visit via Video Note  I connected with Richard Raymond on 08/10/19 at  2:00 PM EDT by a video enabled telemedicine application and verified that I am speaking with the correct person using two identifiers.  Location: Patient: Home Provider: Office   I discussed the limitations of evaluation and management by telemedicine and the availability of in person appointments. The patient expressed understanding and agreed to proceed.   THERAPIST PROGRESS NOTE  Session Time: 2:00 pm-2:45 pm  Participation Level: Active  Behavioral Response: CasualAlertDepressed  Type of Therapy: Individual Therapy  Treatment Goals addressed: Coping  Interventions: CBT and Solution Focused  Summary: Richard Raymond is a 18 y.o. adult who presents oriented x5 (person, place, situation, time, and object), casually dressed, appropriately groomed, average height, overweight, and cooperative to address mood and anxiety. Patient has minimal history of medical treatment including abdominal migraine. Patient has a history of mental health treatment including outpatient therapy, medication management, and hospitalization for SI. Patient denies current SI and denies homicidal ideations. Patient denies psychosis including auditory and visual hallucinations. Patient denies substance abuse. Patient is at low risk for lethality at this time.  Physically: Patient has experienced disrupted sleep. They were taking care of cats late into the evening. Patient has had limited physical activity. Patient also notes that they forget to eat at times.  Spiritually/values: Patient does not feel they are living up to their personal values and beliefs. Patient feels like they get distracted easily with their own personal interest which makes it difficulty to complete what they need to do.  Relationships: Patient's relationships are going ok. They feel like their relationships with family are "normal" as in how they usually interact and  communicate. Patient feels okay with how they interact with their family.  Emotionally/Mentally/Behavior: Patient's mood was described as frustrated, cheerful, lazy, and tired. Patient admitted they spend too much time on personal interests such as reading manga's rather than completing what they need to. Patient was able to identify behaviors or activities they engage in that improve mood such as styling their hair in a more feminine way, wearing and purchasing clothing that is more feminine, and shaving their legs and arms. Patient feels like these are activities that make them feel better about themselves. Patient also noted that they enjoy making music on their computer. Patient understood that they need to have balance between personal interests and getting things completed such as school or getting a job. Patient also understood the importance of paying attention to thoughts and how that can influence how they view situations as well as react.   Patient engaged in session. They responded well to interventions. Patient continues to meet criteria for Moderate episode of recurrent major depressive disorder. Patient will continue in outpatient therapy due to being the least restrictive service to meet their needs. Patient made minimal progress on their goals at this time.   Suicidal/Homicidal: Negativewithout intent/plan  Therapist Response: Therapist reviewed patient's recent thoughts and behavior. Therapist utilized CBT to address mood. Therapist processed patient's thoughts to identify triggers for mood. Therapist assisted patient in identifying behaviors or activities that improve mood and self esteem. Therapist educated patient on CBT and the importance of thoughts that lead to feelings that lead to reactions/behaviors.   Plan: Return again in 2 weeks.  Diagnosis: Axis I: Moderate epsisode of recurrent major depressive disorder    Axis II: No diagnosis  I discussed the assessment and treatment  plan with the patient. The patient was provided an opportunity  to ask questions and all were answered. The patient agreed with the plan and demonstrated an understanding of the instructions.   The patient was advised to call back or seek an in-person evaluation if the symptoms worsen or if the condition fails to improve as anticipated.  I provided 45 minutes of non-face-to-face time during this encounter.   Glori Bickers, LCSW 08/10/2019

## 2019-08-21 ENCOUNTER — Other Ambulatory Visit: Payer: Self-pay | Admitting: Child and Adolescent Psychiatry

## 2019-08-23 ENCOUNTER — Other Ambulatory Visit: Payer: Self-pay

## 2019-08-23 ENCOUNTER — Ambulatory Visit (INDEPENDENT_AMBULATORY_CARE_PROVIDER_SITE_OTHER): Payer: 59 | Admitting: Licensed Clinical Social Worker

## 2019-08-23 DIAGNOSIS — F331 Major depressive disorder, recurrent, moderate: Secondary | ICD-10-CM | POA: Diagnosis not present

## 2019-08-24 ENCOUNTER — Encounter (HOSPITAL_COMMUNITY): Payer: Self-pay | Admitting: Licensed Clinical Social Worker

## 2019-08-24 NOTE — Progress Notes (Signed)
Virtual Visit via Video Note  I connected with Richard Raymond on 08/24/19 at  4:00 PM EDT by a video enabled telemedicine application and verified that I am speaking with the correct person using two identifiers.  Location: Patient: Home Provider: Office   I discussed the limitations of evaluation and management by telemedicine and the availability of in person appointments. The patient expressed understanding and agreed to proceed.   THERAPIST PROGRESS NOTE  Session Time: 2:00 pm-2:45 pm  Participation Level: Active  Behavioral Response: CasualAlertDepressed  Type of Therapy: Individual Therapy  Treatment Goals addressed: Coping  Interventions: CBT and Solution Focused  Summary: Richard Raymond is a 18 y.o. adult who presents oriented x5 (person, place, situation, time, and object), casually dressed, appropriately groomed, average height, overweight, and cooperative to address mood and anxiety. Patient has minimal history of medical treatment including abdominal migraine. Patient has a history of mental health treatment including outpatient therapy, medication management, and hospitalization for SI. Patient denies current SI and denies homicidal ideations. Patient denies psychosis including auditory and visual hallucinations. Patient denies substance abuse. Patient is at low risk for lethality at this time.  Physically: Patient's sleep has still been off. They continue to stay awake for several hours after going to bed. Patient identified that they could start exercising during the day which would help make them tired. Patient also noted that they could take an anxiety medication at night would would help them relax at night.  Spiritually/values: Patient identified that they have been dressing more in tune with who they feel they are, shaving body hair off, and working on their voice to sound more feminine. Relationships: Patient came out to a friend as Trans. They feel like it went well but  it was "awkward and uncertain" but they did it anyway. Their friend responded well to them. Patient feels like their relationships are going well. They are getting along with family for the most part. They have limited contact/interaction with their father.   Emotionally/Mentally/Behavior: Patient's mood was more stable and neutral. They noted that there were still highs and lows but they were in a manageable range. Patient has been sending in applications to stores and has applied at [employer] as well as [employer]. Patient feels good about taking steps, even small ones to accomplish their goals.   Patient engaged in session. They responded well to interventions. Patient continues to meet criteria for Moderate episode of recurrent major depressive disorder. Patient will continue in outpatient therapy due to being the least restrictive service to meet their needs. Patient made minimal progress on their goals at this time.   Suicidal/Homicidal: Negativewithout intent/plan  Therapist Response: Therapist reviewed patient's recent thoughts and behavior. Therapist utilized CBT to address mood. Therapist processed patient's thoughts to identify triggers for mood. Therapist assisted patient in identifying thoughts and behaviors that impact mood and help them feel more like themselves.    Plan: Return again in 2 weeks.  Diagnosis: Axis I: Moderate epsisode of recurrent major depressive disorder    Axis II: No diagnosis  I discussed the assessment and treatment plan with the patient. The patient was provided an opportunity to ask questions and all were answered. The patient agreed with the plan and demonstrated an understanding of the instructions.   The patient was advised to call back or seek an in-person evaluation if the symptoms worsen or if the condition fails to improve as anticipated.  I provided 45 minutes of non-face-to-face time during this encounter.  Glori Bickers, LCSW 08/24/2019

## 2019-08-27 ENCOUNTER — Other Ambulatory Visit: Payer: Self-pay

## 2019-08-27 ENCOUNTER — Ambulatory Visit (INDEPENDENT_AMBULATORY_CARE_PROVIDER_SITE_OTHER): Payer: 59 | Admitting: Child and Adolescent Psychiatry

## 2019-08-27 DIAGNOSIS — F422 Mixed obsessional thoughts and acts: Secondary | ICD-10-CM | POA: Diagnosis not present

## 2019-08-27 DIAGNOSIS — F330 Major depressive disorder, recurrent, mild: Secondary | ICD-10-CM

## 2019-08-27 DIAGNOSIS — F418 Other specified anxiety disorders: Secondary | ICD-10-CM

## 2019-08-27 NOTE — Progress Notes (Signed)
Virtual Visit via Video Note  I connected with Richard Raymond on 08/27/19 at 10:30 AM EDT by a video enabled telemedicine application and verified that I am speaking with the correct person using two identifiers.  Location: Patient: Home Provider: Office   I discussed the limitations of evaluation and management by telemedicine and the availability of in person appointments. The patient expressed understanding and agreed to proceed.    I discussed the assessment and treatment plan with the patient. The patient was provided an opportunity to ask questions and all were answered. The patient agreed with the plan and demonstrated an understanding of the instructions.   The patient was advised to call back or seek an in-person evaluation if the symptoms worsen or if the condition fails to improve as anticipated.  I provided 30 minutes of non-face-to-face time during this encounter.   Orlene Erm, Richard Raymond    [facility] Richard Raymond/PA/NP OP Progress Note  08/27/2019 9:32 AM Richard Raymond  MRN:  [id number]  Chief Complaint: Medication management follow-up for depression, anxiety, OCD, gender dysphoria.  HPI: This is an 18 year old Caucasian, assigned male at birth(AMAB), currently identifying self as transgender male preferring she/her pronoun with psychiatric history significant of depression, anxiety, OCD and gender dysphoria was seen and evaluated over telemedicine encounter for medication management follow-up.  In the interim since the last visit she has continued to see her therapist every other week and denies any acute medical events.  She reports that overall she is doing well with her mood and her anxiety, denies feeling depressed, and anxiety has been manageable.  She reports overall she is sleeping well but recently noted some disruption in her sleep cycle.  She reports that she has been spending her time, doing her schoolwork, doing some chores, and playing games.  She denies any thoughts of suicide or  self-harm except rare occasional fleeting suicidal thoughts that last for about 5 seconds.  She reports that she continues to have obsessive thoughts however it has been easier to manage them.  She reports that she has continued taking medications regularly and denies any side effects with them.  She reports that she would like to continue on the same dose of the medication.  Writer spoke with her mother for collateral information.  She reports that her concern for patient is that she is unable to stay motivated to do her schoolwork or finding a job and instead spending most of the time playing video games.  Mother also asked if patient related stricture ASD.  Mother reports that they have been very supportive of her gender identity.  We discussed patient's struggle with academic functioning and flexibility in her thinking and therefore recommended her to offer support and help with schoolwork and looking for a job.  Patient had done very well in her last 2 exams and has 3 more exams to take.  Mother was also recommended to get a referral for gender clinic to discuss options for treatment for gender dysphoria.  Mother was given information about grades from clinic foundation and LGBT support group.  Mother was provided permission of [facility] and [facility] for autism and brain development for autism testing.  Mother verbalized understanding.   Visit Diagnosis:    ICD-10-CM   1. Other specified anxiety disorders  F41.8   2. Mixed obsessional thoughts and acts  F42.2   3. Mild episode of recurrent major depressive disorder (HCC)  F33.0     Past Psychiatric History:As mentioned in initial H&P,  reviewed today, no change, psychiatric hospitalization at [facility] for a week at the beginning of August, Prozac was increased to 40 mg once a day.  She also started seeing Richard Raymond for ind therapy.   Past Medical History:  Past Medical History:  Diagnosis Date  . Abdominal migraine   . Medical  history non-contributory     Past Surgical History:  Procedure Laterality Date  . WISDOM TOOTH EXTRACTION      Family Psychiatric History: As mentioned in initial H&P, reviewed today, no change   Family History:  Family History  Problem Relation Age of Onset  . ADD / ADHD Brother     Social History:  Social History   Socioeconomic History  . Marital status: Single    Spouse name: Not on file  . Number of children: 0  . Years of education: Not on file  . Highest education level: 12th grade  Occupational History  . Not on file  Social Needs  . Financial resource strain: Not hard at all  . Food insecurity    Worry: Never true    Inability: Never true  . Transportation needs    Medical: Yes    Non-medical: Yes  Tobacco Use  . Smoking status: Never Smoker  . Smokeless tobacco: Never Used  Substance and Sexual Activity  . Alcohol use: No  . Drug use: No  . Sexual activity: Not Currently  Lifestyle  . Physical activity    Days per week: 0 days    Minutes per session: 0 min  . Stress: Very much  Relationships  . Social Musician on phone: Not on file    Gets together: Not on file    Attends religious service: Never    Active member of club or organization: Yes    Attends meetings of clubs or organizations: 1 to 4 times per year    Relationship status: Never married  Other Topics Concern  . Not on file  Social History Narrative  . Not on file    Allergies:  Allergies  Allergen Reactions  . Cefzil [Cefprozil]     Diarrhea    Metabolic Disorder Labs: Lab Results  Component Value Date   HGBA1C 5.3 06/28/2019   MPG 105.41 06/28/2019   Lab Results  Component Value Date   PROLACTIN 17.5 (H) 06/28/2019   Lab Results  Component Value Date   CHOL 197 (H) 06/28/2019   TRIG 87 06/28/2019   HDL 29 (L) 06/28/2019   CHOLHDL 6.8 06/28/2019   VLDL 17 06/28/2019   LDLCALC 151 (H) 06/28/2019   Lab Results  Component Value Date   TSH 1.930  06/22/2019    Therapeutic Level Labs: No results found for: LITHIUM No results found for: VALPROATE No components found for:  CBMZ  Current Medications: Current Outpatient Medications  Medication Sig Dispense Refill  . FLUoxetine (PROZAC) 40 MG capsule TAKE 1 CAPSULE BY MOUTH EVERY DAY 30 capsule 0  . hydrOXYzine (ATARAX/VISTARIL) 25 MG tablet Take 1 tablet (25 mg total) by mouth at bedtime as needed for anxiety (sleep). 30 tablet 0   No current facility-administered medications for this visit.      Musculoskeletal: Strength & Muscle Tone: unable to assess since visit was over the telemedicine. Gait & Station: unable to assess since visit was over the telemedicine. Patient leans: N/A  Psychiatric Specialty Exam: ROSReview of 12 systems negative except as mentioned in HPI   Mental Status Exam: Appearance: casually  dressed; long hair; no overt signs of trauma or distress noted Attitude: calm, cooperative with fair eye contact Activity: No PMA/PMR, no tics/no tremors; no EPS noted  Speech: speech latency Thought Process: Linear, Goal directed.  Associations: some thought blocking Thought Content: (abnormal/psychotic thoughts): no abnormal or delusional thought process evidenced SI/HI: denies Si/Hi Perception: no illusions or visual/auditory hallucinations noted; no response to internal stimuli demonstrated Mood & Affect: "good"/constricted .  Judgment & Insight: both fair Attention and Concentration : Good Cognition : WNL Language : Good ADL - Intact   Assessment and Plan:   # Depression (improving) - Continue Prozac 40 mg daily - Side effects including but not limited to nausea, vomiting, diarrhea, constipation, headaches, dizziness, black box warning of suicidal thoughts with SSRI were discussed with pt. Pt provided informed consent.  - Therapy scheduled with Mr Sheets every other week.   - Labs from inpatient reviewed, CBC and CMP stable, Cholesterol 197 with HDL 29  and LDL 151; HbA1C 5.3 and TSH - WNL.    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  - Same as mentioned above.   # OCD (improbinh) - His reports are most consistent with OCD.  - Same as mentioned above.   # Asperger's syndrome/ASD (chronic) - Mother's report of some sensory issues, difficulties with transition when he was young, concreteness, concerning for Asperger's syndrome. - Recommended [facility] and [facility] for ASD testing to mother.   # Gender dysphoria - at present would like to use she/her pronoun and name - Richard Raymond - parents are aware and appears supportive - Recommended to speak with PCP for referral to endocrine for gender dysphoria - Also provided information about [facility] and LGBTQ support groups   Mother phone  - [phone]    Richard SmallingHiren M Benuel Ly, Richard Raymond  Richard SmallingHiren M Laqueshia Cihlar, Richard Raymond 08/28/2019, 9:32 AM

## 2019-08-28 ENCOUNTER — Encounter: Payer: Self-pay | Admitting: Child and Adolescent Psychiatry

## 2019-08-28 DIAGNOSIS — F330 Major depressive disorder, recurrent, mild: Secondary | ICD-10-CM | POA: Insufficient documentation

## 2019-08-28 MED ORDER — HYDROXYZINE HCL 25 MG PO TABS: 25.0000 mg | ORAL_TABLET | Freq: Every evening | ORAL | 0 refills | Status: DC | PRN

## 2019-08-28 MED ORDER — FLUOXETINE HCL 40 MG PO CAPS: ORAL_CAPSULE | ORAL | 1 refills | Status: DC

## 2019-09-06 ENCOUNTER — Other Ambulatory Visit: Payer: Self-pay

## 2019-09-06 ENCOUNTER — Ambulatory Visit (HOSPITAL_COMMUNITY): Payer: 59 | Admitting: Licensed Clinical Social Worker

## 2019-09-20 ENCOUNTER — Ambulatory Visit (HOSPITAL_COMMUNITY): Payer: 59 | Admitting: Licensed Clinical Social Worker

## 2019-09-20 ENCOUNTER — Other Ambulatory Visit: Payer: Self-pay

## 2019-10-04 ENCOUNTER — Ambulatory Visit (INDEPENDENT_AMBULATORY_CARE_PROVIDER_SITE_OTHER): Payer: 59 | Admitting: Licensed Clinical Social Worker

## 2019-10-04 ENCOUNTER — Encounter (HOSPITAL_COMMUNITY): Payer: Self-pay | Admitting: Licensed Clinical Social Worker

## 2019-10-04 ENCOUNTER — Other Ambulatory Visit: Payer: Self-pay

## 2019-10-04 DIAGNOSIS — F331 Major depressive disorder, recurrent, moderate: Secondary | ICD-10-CM | POA: Diagnosis not present

## 2019-10-04 DIAGNOSIS — F418 Other specified anxiety disorders: Secondary | ICD-10-CM
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
## 2019-10-07 ENCOUNTER — Other Ambulatory Visit: Payer: Self-pay

## 2019-10-07 ENCOUNTER — Ambulatory Visit (INDEPENDENT_AMBULATORY_CARE_PROVIDER_SITE_OTHER): Payer: 59 | Admitting: Child and Adolescent Psychiatry

## 2019-10-07 DIAGNOSIS — F418 Other specified anxiety disorders: Secondary | ICD-10-CM

## 2019-10-07 DIAGNOSIS — F422 Mixed obsessional thoughts and acts: Secondary | ICD-10-CM

## 2019-10-07 DIAGNOSIS — F330 Major depressive disorder, recurrent, mild: Secondary | ICD-10-CM | POA: Diagnosis not present

## 2019-10-07 NOTE — Progress Notes (Signed)
Virtual Visit via Video Note  I connected with Richard Raymond on 08/27/19 at  2:30 PM EST by a video enabled telemedicine application and verified that I am speaking with the correct person using two identifiers.  Location: Patient: Home Provider: Office   I discussed the limitations of evaluation and management by telemedicine and the availability of in person appointments. The patient expressed understanding and agreed to proceed.    I discussed the assessment and treatment plan with the patient. The patient was provided an opportunity to ask questions and all were answered. The patient agreed with the plan and demonstrated an understanding of the instructions.   The patient was advised to call back or seek an in-person evaluation if the symptoms worsen or if the condition fails to improve as anticipated.  I provided 30 minutes of non-face-to-face time during this encounter.   Richard SmallingHiren M Umrania, MD    [facility] OP Progress Note  08/27/2019 9:29 AM Richard Raymond  MRN:  [id number]  Chief Complaint: Medication management follow-up for depression, anxiety, OCD, gender dysphoria.  HPI: This is an 18 year old Caucasian, assigned male at birth(AMAB), currently identifying his transgender male(she/her) with psychiatric history significant of depression, anxiety and OCD and gender dysphoria was evaluated over telemedicine encounter for medication management follow-up.  In the interim since her last visit she had seen her therapist once and missed her other 2 appointments.  She was evaluated over video partly and then visit was switched over to phone because of the poor video quality. Depression - Reports that her mood has been "neutral", rates sadness at 4/10(10 = most depressed), denies any suicidal thoughts or self harm thoughts, reports sleeping as usual but overall better, denies anhedonia.   Anxiety - Rates anxiety at 5/10(10 = most anxious) in the context of wanting to share her gender  id with her grand parents, and fearing that her maternal GM would not respond approprietly. We discussed the strategies to let her grandparents know, and discussed that they may not immediatly accept it but eventually may do. She continues to have obsessive thoughts but easier to manage.   She reports that she has asked her mother to pierce her ears, which she took it ok.   Mother reports that she believes Richard Raymond, but continues to struggle with motivation to get a job or do her school work. She reports that [facility] and waiting to hear back.   Visit Diagnosis:    ICD-10-CM   1. Mild episode of recurrent major depressive disorder (HCC)  F33.0   2. Mixed obsessional thoughts and acts  F42.2   3. Other specified anxiety disorders  F41.8     Past Psychiatric History:As mentioned in initial H&P, reviewed today, [facility], Prozac was increased to 40 mg once a day.  She also started seeing Mr. Richard BellowsJoshua Raymond for ind therapy.   Past Medical History:  Past Medical History:  Diagnosis Date  . Abdominal migraine   . Medical history non-contributory     Past Surgical History:  Procedure Laterality Date  . WISDOM TOOTH EXTRACTION      Family Psychiatric History: As mentioned in initial H&P, reviewed today, no change    Family History:  Family History  Problem Relation Age of Onset  . ADD / ADHD Brother     Social History:  Social History   Socioeconomic History  .  Marital status: Single    Spouse name: Not on file  . Number of children: 0  . Years of education: Not on file  . Highest education level: 12th grade  Occupational History  . Not on file  Social Needs  . Financial resource strain: Not hard at all  . Food insecurity    Worry: Never true    Inability: Never true  . Transportation needs    Medical: Yes    Non-medical: Yes  Tobacco Use  . Smoking status:  Never Smoker  . Smokeless tobacco: Never Used  Substance and Sexual Activity  . Alcohol use: No  . Drug use: No  . Sexual activity: Not Currently  Lifestyle  . Physical activity    Days per week: 0 days    Minutes per session: 0 min  . Stress: Very much  Relationships  . Social Musician on phone: Not on file    Gets together: Not on file    Attends religious service: Never    Active member of club or organization: Yes    Attends meetings of clubs or organizations: 1 to 4 times per year    Relationship status: Never married  Other Topics Concern  . Not on file  Social History Narrative  . Not on file    Allergies:  Allergies  Allergen Reactions  . Cefzil [Cefprozil]     Diarrhea    Metabolic Disorder Labs: Lab Results  Component Value Date   HGBA1C 5.3 06/28/2019   MPG 105.41 06/28/2019   Lab Results  Component Value Date   PROLACTIN 17.5 (H) 06/28/2019   Lab Results  Component Value Date   CHOL 197 (H) 06/28/2019   TRIG 87 06/28/2019   HDL 29 (L) 06/28/2019   CHOLHDL 6.8 06/28/2019   VLDL 17 06/28/2019   LDLCALC 151 (H) 06/28/2019   Lab Results  Component Value Date   TSH 1.930 06/22/2019    Therapeutic Level Labs: No results found for: LITHIUM No results found for: VALPROATE No components found for:  CBMZ  Current Medications: Current Outpatient Medications  Medication Sig Dispense Refill  . FLUoxetine (PROZAC) 40 MG capsule TAKE 1 CAPSULE BY MOUTH EVERY DAY 30 capsule 1  . hydrOXYzine (ATARAX/VISTARIL) 25 MG tablet Take 1 tablet (25 mg total) by mouth at bedtime as needed for anxiety (sleep). 30 tablet 0   No current facility-administered medications for this visit.      Musculoskeletal: Strength & Muscle Tone: unable to assess since visit was over the telemedicine. Gait & Station:unable to assess since visit was over the telemedicine. Patient leans: N/A  Psychiatric Specialty Exam: ROSReview of 12 systems negative except as  mentioned in HPI   Mental Status Exam: Appearance: casually dressed;fairly groomed; no overt signs of trauma or distress noted Attitude: calm, cooperative with good eye contact Activity: No PMA/PMR, no tics/no tremors; no EPS noted  Speech: Dysprosodic  Thought Process: Logical, linear, and goal-directed.  Associations: no looseness, tangentiality, circumstantiality, flight of ideas, thought blocking or word salad noted Thought Content: (abnormal/psychotic thoughts): no abnormal or delusional thought process evidenced SI/HI: denies Si/Hi Perception: no illusions or visual/auditory hallucinations noted; no response to internal stimuli demonstrated Mood & Affect: "neutral"/cosntricted Judgment & Insight: both fair Attention and Concentration : Good Cognition : WNL Language : Good ADL - Intact   Assessment and Plan:   # Depression (chronic and stable) - Continue Prozac 40 mg daily - Side effects including but not limited to  nausea, vomiting, diarrhea, constipation, headaches, dizziness, black box warning of suicidal thoughts with SSRI were discussed with pt. Pt provided informed consent.  - Therapy scheduled with Mr Raymond every other week.   - Labs from inpatient reviewed, CBC and CMP stable, Cholesterol 197 with HDL 29 and LDL 151; HbA1C 5.3 and TSH - WNL.    # Anxiety (chronic and stable) - His reports are most consistent with generalized and social anxiety disorder with panic attacks.  - Same as mentioned above.   # OCD (chronic and stable) - His reports are most consistent with OCD.  - Same as mentioned above.   # Asperger's syndrome/ASD (chronic) - Mother's report of some sensory issues, difficulties with transition when he was young, concreteness, concerning for Asperger's syndrome. - Recommended [place] [facility] [place] [facility] for ASD testing to mother.  - Mother has reached out and waiting to hear back  # Gender dysphoria - at present would like to use she/her  pronoun and name - Richard Raymond - parents are aware and appears supportive - Recommended to speak with PCP for referral to endocrine for gender dysphoria, not yet considered for the referralyet - Also provided information about [employer] and LGBTQ support groups - not yet reached out  # Acute Intermittent porphyria(AIP) - Concerns for AIP. He has hx of abdominal migraine which he describes as intermittent episodes of uneasiness in stomach and moderate to severe abdominal pain accompanied with mild numbness in his body, bloating. His father has similar hx and perhaps worse attacks than him. Spoke with porphyria foundation representative previously on their physician consultation line. He is referred to GI by PCP - Has not yet followed up. M reports that she will follow up on this.    Mother phone  - [phone]    Orlene Erm, MD  Orlene Erm, MD 10/08/2019, 9:29 AM

## 2019-10-08 ENCOUNTER — Encounter: Payer: Self-pay | Admitting: Child and Adolescent Psychiatry

## 2019-10-08 MED ORDER — FLUOXETINE HCL 40 MG PO CAPS: ORAL_CAPSULE | ORAL | 1 refills | Status: DC

## 2019-10-08 MED ORDER — HYDROXYZINE HCL 25 MG PO TABS: 25.0000 mg | ORAL_TABLET | Freq: Every evening | ORAL | 1 refills | Status: DC | PRN

## 2019-10-18 ENCOUNTER — Other Ambulatory Visit: Payer: Self-pay

## 2019-10-18 ENCOUNTER — Ambulatory Visit (HOSPITAL_COMMUNITY): Payer: 59 | Admitting: Licensed Clinical Social Worker

## 2019-11-01 ENCOUNTER — Other Ambulatory Visit: Payer: Self-pay

## 2019-11-01 ENCOUNTER — Ambulatory Visit (INDEPENDENT_AMBULATORY_CARE_PROVIDER_SITE_OTHER): Payer: 59 | Admitting: Licensed Clinical Social Worker

## 2019-11-01 DIAGNOSIS — F330 Major depressive disorder, recurrent, mild: Secondary | ICD-10-CM | POA: Diagnosis not present

## 2019-11-02 ENCOUNTER — Encounter (HOSPITAL_COMMUNITY): Payer: Self-pay | Admitting: Licensed Clinical Social Worker

## 2019-11-02 NOTE — Progress Notes (Signed)
Virtual Visit via Video Note  I connected with Richard Raymond on 11/02/19 at  3:00 PM EST by a video enabled telemedicine application and verified that I am speaking with the correct person using two identifiers.  Location: Patient: Home Provider: Office   I discussed the limitations of evaluation and management by telemedicine and the availability of in person appointments. The patient expressed understanding and agreed to proceed.   THERAPIST PROGRESS NOTE  Session Time: 3:00 pm-3:45 pm  Participation Level: Active  Behavioral Response: CasualAlertDepressed  Type of Therapy: Individual Therapy  Treatment Goals addressed: Coping  Interventions: CBT and Solution Focused  Summary: Richard Raymond is a 18 y.o. adult who presents oriented x5 (person, place, situation, time, and object), casually dressed, appropriately groomed, average height, overweight, and cooperative to address mood and anxiety. Patient has minimal history of medical treatment including abdominal migraine. Patient has a history of mental health treatment including outpatient therapy, medication management, and hospitalization for SI. Patient denies current SI and denies homicidal ideations. Patient denies psychosis including auditory and visual hallucinations. Patient denies substance abuse. Patient is at low risk for lethality at this time.  Physically: Patient's sleep pattern has been off. They admitted they were staying up later than they should. They have not been drinking water consistently and identified that they need to drink more water.    Spiritually/values: Patient has not been taking care of themself such as shaving.They felt like they could not since their grandmother was in town but now that their grandmother is gone they are going to start dressing like their authentic self.  Relationships: Patient's maternal grandmother is no longer in town. Patient feels relief at this. Patient is getting along with their  family.  Emotionally/Mentally/Behavior: Patient's mood had improved slightly. They feel like now that their grandmother is gone they can be their authentic self. Patient applied for a job and had an interview. They are feeling very hopeful at the potential of a job. Patient noted that they have coped with their disrupted mood by having physical touch, talking with friends as well as family, and "annoying" his brother.    Patient engaged in session. They responded well to interventions. Patient continues to meet criteria for Moderate episode of recurrent major depressive disorder. Patient will continue in outpatient therapy due to being the least restrictive service to meet their needs. Patient made moderate progress on their goals at this time.   Suicidal/Homicidal: Negativewithout intent/plan  Therapist Response: Therapist reviewed patient's recent thoughts and behavior. Therapist utilized CBT to address mood. Therapist processed patient's thoughts to identify triggers for mood. Therapist assisted patient in identifying what has gone well and resources to manage mood.   Plan: Return again in 2 weeks.  Diagnosis: Axis I: Moderate epsisode of recurrent major depressive disorder    Axis II: No diagnosis  I discussed the assessment and treatment plan with the patient. The patient was provided an opportunity to ask questions and all were answered. The patient agreed with the plan and demonstrated an understanding of the instructions.   The patient was advised to call back or seek an in-person evaluation if the symptoms worsen or if the condition fails to improve as anticipated.  I provided 45 minutes of non-face-to-face time during this encounter.   Glori Bickers, LCSW 11/02/2019

## 2019-11-16 ENCOUNTER — Ambulatory Visit: Payer: 59 | Admitting: Child and Adolescent Psychiatry

## 2019-11-16 ENCOUNTER — Other Ambulatory Visit: Payer: Self-pay

## 2019-11-17 ENCOUNTER — Ambulatory Visit (INDEPENDENT_AMBULATORY_CARE_PROVIDER_SITE_OTHER): Payer: 59 | Admitting: Child and Adolescent Psychiatry

## 2019-11-17 DIAGNOSIS — F418 Other specified anxiety disorders: Secondary | ICD-10-CM

## 2019-11-17 DIAGNOSIS — F331 Major depressive disorder, recurrent, moderate: Secondary | ICD-10-CM | POA: Diagnosis not present

## 2019-11-17 DIAGNOSIS — F422 Mixed obsessional thoughts and acts: Secondary | ICD-10-CM | POA: Diagnosis not present

## 2019-11-17 NOTE — Progress Notes (Signed)
Virtual Visit via Telephone Note  I connected with Richard Raymond on 11/18/19 at  4:30 PM EST by telephone and verified that I am speaking with the correct person using two identifiers.  Location: Patient: home Provider: office   I discussed the limitations, risks, security and privacy concerns of performing an evaluation and management service by telephone and the availability of in person appointments. I also discussed with the patient that there may be a patient responsible charge related to this service. The patient expressed understanding and agreed to proceed.    I discussed the assessment and treatment plan with the patient. The patient was provided an opportunity to ask questions and all were answered. The patient agreed with the plan and demonstrated an understanding of the instructions.   The patient was advised to call back or seek an in-person evaluation if the symptoms worsen or if the condition fails to improve as anticipated.  I provided 30 minutes of non-face-to-face time during this encounter.   [name], MD     [facility] MD/PA/NP OP Progress Note  11/17/2019 4:30 PM  Carlis Martone  MRN:  [id number]  Chief Complaint:Medication management follow up for depression, anxiety, OCD  HPI: This is a 18 year old Caucasian, single male at birth(AMAB), currently identifying as transgender male(she/her) and prefers name "Richard Raymond", with psychiatric history significant of depression, anxiety, OCD and gender dysphoria was evaluated on telemedicine encounter for medication refill follow-up.  In the interim since the last visit she had continued to see her therapist, however she missed her last appointment because she is slept over.  She reports that she has been doing well overall however has some days which are good, some days with bad and some days are neutral.  She reports that on the bad days her mood is more depressed/anxious, recently she thought her gaming account was hacked and  following which she could not sleep for the entire night due to anxiety around it. She reports that she was able to recover her account so she started feeling better after that. She reports that she has noticed improvement with her anxiety and OCD, but recently been feeling very tired, has been having problems with sleep, and intermittent depressed mood. She reports that she does not have any suicidal thoughts/self harm thoughts. She reports that she has been taking her meds regularly. We discussed to increase Prozac to 60 mg daily to better control her symptoms.     Visit Diagnosis:    ICD-10-CM   1. Mild episode of recurrent major depressive disorder (HCC)  F33.0 FLUoxetine (PROZAC) 40 MG capsule    FLUoxetine (PROZAC) 20 MG capsule  2. Mixed obsessional thoughts and acts  F42.2 FLUoxetine (PROZAC) 40 MG capsule    FLUoxetine (PROZAC) 20 MG capsule    hydrOXYzine (ATARAX/VISTARIL) 25 MG tablet  3. Other specified anxiety disorders  F41.8 FLUoxetine (PROZAC) 40 MG capsule    FLUoxetine (PROZAC) 20 MG capsule    hydrOXYzine (ATARAX/VISTARIL) 25 MG tablet    Past Psychiatric History:reviewed today no change from last visit, psychiatric hospitalization at [facility] for a week at the beginning of August, Prozac was increased to 40 mg once a day.  She also started seeing Mr. Bynum Bellows for ind therapy.   Past Medical History:  Past Medical History:  Diagnosis Date  . Abdominal migraine   . Medical history non-contributory     Past Surgical History:  Procedure Laterality Date  . WISDOM TOOTH EXTRACTION      Family  Psychiatric History: reviewed today, no change    Family History:  Family History  Problem Relation Age of Onset  . ADD / ADHD Brother     Social History:  Social History   Socioeconomic History  . Marital status: Single    Spouse name: Not on file  . Number of children: 0  . Years of education: Not on file  . Highest education level: 12th grade  Occupational History   . Not on file  Tobacco Use  . Smoking status: Never Smoker  . Smokeless tobacco: Never Used  Substance and Sexual Activity  . Alcohol use: No  . Drug use: No  . Sexual activity: Not Currently  Other Topics Concern  . Not on file  Social History Narrative  . Not on file   Social Determinants of Health   Financial Resource Strain: Low Risk   . Difficulty of Paying Living Expenses: Not hard at all  Food Insecurity: No Food Insecurity  . Worried About Programme researcher, broadcasting/film/videounning Out of Food in the Last Year: Never true  . Ran Out of Food in the Last Year: Never true  Transportation Needs: Unmet Transportation Needs  . Lack of Transportation (Medical): Yes  . Lack of Transportation (Non-Medical): Yes  Physical Activity: Inactive  . Days of Exercise per Week: 0 days  . Minutes of Exercise per Session: 0 min  Stress: Stress Concern Present  . Feeling of Stress : Very much  Social Connections: Unknown  . Frequency of Communication with Friends and Family: Not on file  . Frequency of Social Gatherings with Friends and Family: Not on file  . Attends Religious Services: Never  . Active Member of Clubs or Organizations: Yes  . Attends BankerClub or Organization Meetings: 1 to 4 times per year  . Marital Status: Never married    Allergies:  Allergies  Allergen Reactions  . Cefzil [Cefprozil]     Diarrhea    Metabolic Disorder Labs: Lab Results  Component Value Date   HGBA1C 5.3 06/28/2019   MPG 105.41 06/28/2019   Lab Results  Component Value Date   PROLACTIN 17.5 (H) 06/28/2019   Lab Results  Component Value Date   CHOL 197 (H) 06/28/2019   TRIG 87 06/28/2019   HDL 29 (L) 06/28/2019   CHOLHDL 6.8 06/28/2019   VLDL 17 06/28/2019   LDLCALC 151 (H) 06/28/2019   Lab Results  Component Value Date   TSH 1.930 06/22/2019    Therapeutic Level Labs: No results found for: LITHIUM No results found for: VALPROATE No components found for:  CBMZ  Current Medications: Current Outpatient  Medications  Medication Sig Dispense Refill  . FLUoxetine (PROZAC) 20 MG capsule Take 1 capsule (20 mg total) by mouth daily. To be combined with Prozac 40 mg daily. 30 capsule 1  . FLUoxetine (PROZAC) 40 MG capsule TAKE 1 CAPSULE BY MOUTH EVERY DAY 30 capsule 1  . hydrOXYzine (ATARAX/VISTARIL) 25 MG tablet Take 1 tablet (25 mg total) by mouth at bedtime as needed for anxiety (sleep). 30 tablet 1   No current facility-administered medications for this visit.     Musculoskeletal: Strength & Muscle Tone: unable to assess since visit was over the telemedicine. Gait & Station:unable to assess since visit was over the telemedicine. Patient leans: N/A  Psychiatric Specialty Exam: ROSReview of 12 systems negative except as mentioned in HPI   Mental Status Exam:  Appearance: unable to assess since virtual visit was over the telephone Attitude: calm, cooperative  Activity: unable  to assess since virtual visit was over the telephone Speech: slow, with normal volume Thought Process: Linear and goal directed Associations: no looseness, tangentiality, circumstantiality, flight of ideas, thought blocking or word salad noted Thought Content: (abnormal/psychotic thoughts): no abnormal or delusional thought process evidenced SI/HI: denies Si/Hi Perception: no illusions or visual/auditory hallucinations noted; Mood & Affect: "good"/unable to assess since virtual visit was over the telephone  Judgment & Insight: both fair Attention and Concentration : Good Cognition : WNL Language : Good ADL - Intact   Assessment and Plan:   # Depression (recurrent, active, mild to moderate) - Increase Prozac to 60 mg daily - Therapy scheduled with Mr Sheets every other week.   - Labs from inpatient previously reviewed, CBC and CMP stable, Cholesterol 197 with HDL 29 and LDL 151; HbA1C 5.3 and TSH - WNL.    # Anxiety (chronic and stable) - His reports are most consistent with generalized and social anxiety  disorder with panic attacks.  - Same as mentioned above.   # OCD (chronic and stable) - His reports are most consistent with OCD.  - Appears to be improving.   # Asperger's syndrome/ASD (chronic) - Mother's report of some sensory issues, difficulties with transition when he was young, concreteness, concerning for Asperger's syndrome. - Recommended [place] [facility] [place] [facility] for ASD testing to mother.  - Mother has reached out and waiting to hear back  # Gender dysphoria - at present would like to use she/her pronoun and name - Raquel Sarna - parents are aware and appears supportive - Recommended to speak with PCP for referral to endocrine for gender dysphoria, not yet considered for the referral - Also previously provided information about [employer] and LGBTQ support groups - not yet reached out  # Acute Intermittent porphyria(AIP) - There has been some concerns for AIP. He has hx of abdominal migraine which he describes as intermittent episodes of uneasiness in stomach and moderate to severe abdominal pain accompanied with mild numbness in his body, bloating. His father has similar hx and perhaps worse attacks than him. Spoke with porphyria foundation representative previously on their physician consultation line. He is referred to GI by PCP - but they have not followed up. He denies any recent such episodes.  Mother phone  - [phone]    Orlene Erm, MD  Orlene Erm, MD 11/17/2019, 4:30 PM

## 2019-11-18 ENCOUNTER — Encounter: Payer: Self-pay | Admitting: Child and Adolescent Psychiatry

## 2019-11-18 MED ORDER — FLUOXETINE HCL 20 MG PO CAPS: 20.0000 mg | ORAL_CAPSULE | Freq: Every day | ORAL | 1 refills | Status: DC

## 2019-11-18 MED ORDER — HYDROXYZINE HCL 25 MG PO TABS: 25.0000 mg | ORAL_TABLET | Freq: Every evening | ORAL | 1 refills | Status: DC | PRN

## 2019-11-18 MED ORDER — FLUOXETINE HCL 40 MG PO CAPS: ORAL_CAPSULE | ORAL | 1 refills | Status: DC

## 2019-12-14 ENCOUNTER — Other Ambulatory Visit: Payer: Self-pay

## 2019-12-14 ENCOUNTER — Ambulatory Visit (INDEPENDENT_AMBULATORY_CARE_PROVIDER_SITE_OTHER): Payer: 59 | Admitting: Licensed Clinical Social Worker

## 2019-12-14 DIAGNOSIS — F330 Major depressive disorder, recurrent, mild: Secondary | ICD-10-CM

## 2019-12-14 NOTE — Progress Notes (Signed)
Virtual Visit via Video Note  I connected with Richard Raymond on 12/14/19 at  8:00 AM EST by a video enabled telemedicine application and verified that I am speaking with the correct person using two identifiers.  Location: Patient: Home Provider: Office   I discussed the limitations of evaluation and management by telemedicine and the availability of in person appointments. The patient expressed understanding and agreed to proceed.   THERAPIST PROGRESS NOTE  Session Time: 8:00 am-8:45 am  Participation Level: Active  Behavioral Response: CasualAlertDepressed  Type of Therapy: Individual Therapy  Treatment Goals addressed: Coping  Interventions: CBT and Solution Focused  Summary: Richard Raymond is a 19 y.o. adult who presents oriented x5 (person, place, situation, time, and object), casually dressed, appropriately groomed, average height, overweight, and cooperative to address mood and anxiety. Patient has minimal history of medical treatment including abdominal migraine. Patient has a history of mental health treatment including outpatient therapy, medication management, and hospitalization for SI. Patient denies current SI and denies homicidal ideations. Patient denies psychosis including auditory and visual hallucinations. Patient denies substance abuse. Patient is at low risk for lethality at this time.  Physically: Patient is regulating their sleep pattern. It has been off for them but they have started to wake up from 5am -7am. Patient has not been exercising as much recently.     Spiritually/values: Patient is now going by the preferred name of Richard Raymond. They are locating doctors to talk to have a consult with related to transitioning.  Relationships: Patient reports having "neutral" interaction with their father and brother. Patient does not really connect with their father with shared activities. Patient feels like they are into different things and it is hard to improve the  relationship at this time. Patient also noted that there are several things their mother does that irritates them such as constantly being on them about chores or applying for jobs first thing in the morning. Patient is not a morning person.  Emotionally/Mentally/Behavior: Patient's mood has improved. They report an improved mood. Patient also reports anxiety as "not too bad." Patient is frustrated with the job search process. They have applied to several places. They feel like leaving the gender question blank or noting that they don't want to answer as well as being honest about their best work being done individually hurts their chances at a job.  Patient is going to continue to apply for jobs and look for doctors to help them transition.   Patient engaged in session. They responded well to interventions. Patient continues to meet criteria for Moderate episode of recurrent major depressive disorder. Patient will continue in outpatient therapy due to being the least restrictive service to meet their needs. Patient made moderate progress on their goals at this time.   Suicidal/Homicidal: Negativewithout intent/plan  Therapist Response: Therapist reviewed patient's recent thoughts and behavior. Therapist utilized CBT to address mood. Therapist processed patient's thoughts to identify triggers for mood. Therapist assisted patient in identifying what has gone well and frustrations with the job hunt.   Plan: Return again in 2 weeks.  Diagnosis: Axis I: Moderate epsisode of recurrent major depressive disorder    Axis II: No diagnosis  I discussed the assessment and treatment plan with the patient. The patient was provided an opportunity to ask questions and all were answered. The patient agreed with the plan and demonstrated an understanding of the instructions.   The patient was advised to call back or seek an in-person evaluation if the symptoms worsen or  if the condition fails to improve as  anticipated.  I provided 45 minutes of non-face-to-face time during this encounter.   Bynum Bellows, LCSW 12/14/2019

## 2019-12-28 ENCOUNTER — Ambulatory Visit (INDEPENDENT_AMBULATORY_CARE_PROVIDER_SITE_OTHER): Payer: 59 | Admitting: Licensed Clinical Social Worker

## 2019-12-28 DIAGNOSIS — F330 Major depressive disorder, recurrent, mild: Secondary | ICD-10-CM

## 2019-12-28 NOTE — Progress Notes (Signed)
Virtual Visit via Video Note  I connected with Richard Raymond on 12/28/19 at 10:00 AM EST by a video enabled telemedicine application and verified that I am speaking with the correct person using two identifiers.  Location: Patient: Home Provider: Office   I discussed the limitations of evaluation and management by telemedicine and the availability of in person appointments. The patient expressed understanding and agreed to proceed.   THERAPIST PROGRESS NOTE  Session Time: 10:00 am-10:45 am  Participation Level: Active  Behavioral Response: CasualAlertDepressed  Type of Therapy: Individual Therapy  Treatment Goals addressed: Coping  Interventions: CBT and Solution Focused  Summary: Richard Raymond is a 19 y.o. adult who presents oriented x5 (person, place, situation, time, and object), casually dressed, appropriately groomed, average height, overweight, and cooperative to address mood and anxiety. Patient has minimal history of medical treatment including abdominal migraine. Patient has a history of mental health treatment including outpatient therapy, medication management, and hospitalization for SI. Patient denies current SI and denies homicidal ideations. Patient denies psychosis including auditory and visual hallucinations. Patient denies substance abuse. Patient is at low risk for lethality at this time.  Physically: Patient has been tired. They continue to struggle with sleep. Patient has been trying to get to bed earlier when they have to work. Patient's appetite is reduced. Marland Kitchen     Spiritually/values: Patient is now going by the preferred name of Richard Raymond. Patient looked for resources related to transitioning and found that most of the offices are currently closed due to COVID19 or the doctor has retired. .  Relationships: Patient's relationships with their brother and mother are going well. They still feel like things are somewhat strained with their father just due to not having anything  in common with him.  Emotionally/Mentally/Behavior: Patient's mood has improved. They have started working. They feel like this will help them and they are looking forward to starting college later in the year. Patient now has "adult" responsibilities and some of them they appreciate and other's they don't like. Patient feels like they struggle with time management. They recognize that their priorities are not in order and some of the things they need to accomplish such as chores, etc are not a priority for them.   Patient engaged in session. They responded well to interventions. Patient continues to meet criteria for Moderate episode of recurrent major depressive disorder. Patient will continue in outpatient therapy due to being the least restrictive service to meet their needs. Patient made moderate progress on their goals at this time.   Suicidal/Homicidal: Negativewithout intent/plan  Therapist Response: Therapist reviewed patient's recent thoughts and behavior. Therapist utilized CBT to address mood. Therapist processed patient's thoughts to identify triggers for mood. Therapist assisted patient in identifying what has gone well and making "adult" decisions.  Plan: Return again in 2 weeks.  Diagnosis: Axis I: Moderate epsisode of recurrent major depressive disorder    Axis II: No diagnosis  I discussed the assessment and treatment plan with the patient. The patient was provided an opportunity to ask questions and all were answered. The patient agreed with the plan and demonstrated an understanding of the instructions.   The patient was advised to call back or seek an in-person evaluation if the symptoms worsen or if the condition fails to improve as anticipated.  I provided 45 minutes of non-face-to-face time during this encounter.   Bynum Bellows, LCSW 12/28/2019

## 2020-01-12 ENCOUNTER — Other Ambulatory Visit: Payer: Self-pay

## 2020-01-12 ENCOUNTER — Encounter: Payer: Self-pay | Admitting: Child and Adolescent Psychiatry

## 2020-01-12 ENCOUNTER — Ambulatory Visit (INDEPENDENT_AMBULATORY_CARE_PROVIDER_SITE_OTHER): Payer: 59 | Admitting: Child and Adolescent Psychiatry

## 2020-01-12 DIAGNOSIS — F418 Other specified anxiety disorders: Secondary | ICD-10-CM

## 2020-01-12 DIAGNOSIS — F422 Mixed obsessional thoughts and acts: Secondary | ICD-10-CM | POA: Diagnosis not present

## 2020-01-12 DIAGNOSIS — F3341 Major depressive disorder, recurrent, in partial remission: Secondary | ICD-10-CM | POA: Diagnosis not present

## 2020-01-12 MED ORDER — FLUOXETINE HCL 20 MG PO CAPS: 20.0000 mg | ORAL_CAPSULE | Freq: Every day | ORAL | 1 refills | Status: DC

## 2020-01-12 MED ORDER — FLUOXETINE HCL 40 MG PO CAPS: ORAL_CAPSULE | ORAL | 1 refills | Status: DC

## 2020-01-12 MED ORDER — HYDROXYZINE HCL 25 MG PO TABS: 25.0000 mg | ORAL_TABLET | Freq: Every evening | ORAL | 1 refills | Status: DC | PRN

## 2020-01-12 NOTE — Progress Notes (Signed)
Virtual Visit via Telephone Note  I connected with Mako Jablonski on 01/12/20 at 10:00 AM EST by telephone and verified that I am speaking with the correct person using two identifiers.  Location: Patient: home Provider: office   I discussed the limitations, risks, security and privacy concerns of performing an evaluation and management service by telephone and the availability of in person appointments. I also discussed with the patient that there may be a patient responsible charge related to this service. The patient expressed understanding and agreed to proceed.    I discussed the assessment and treatment plan with the patient. The patient was provided an opportunity to ask questions and all were answered. The patient agreed with the plan and demonstrated an understanding of the instructions.   The patient was advised to call back or seek an in-person evaluation if the symptoms worsen or if the condition fails to improve as anticipated.  I provided 30 minutes of non-face-to-face time during this encounter.   [name], MD     [facility] MD/PA/NP OP Progress Note 01/12/20 4:30 PM  Nikoli Petion  MRN:  [id number]  Chief Complaint: Medication management follow-up for depression, anxiety, OCD.  HPI: This is a 19 year old Caucasian, assigned male at birth, currently identifying his transgender male preferring for now she/her and name "[name]" was seen and evaluated over telemedicine encounter for medication management follow-up.  She is currently prescribed Prozac 60 mg once a day and hydroxyzine as needed for anxiety.  In the interim since last visit no acute medical events reported and she reports that she had seen her therapist every other week.    Family reports that she has been doing well, her mood on most days has been happy to neutral, denies any low lows, denies anhedonia, denies any thoughts of suicide or self-harm.  She reports that she has been sleeping well and about twice a week  she takes hydroxyzine for sleep.  She reports that she was able to complete her school and now has high school diploma.  She reports that she has started working at [employer] and works for about 15 hours a week and is planning to attend [employer].  She reports that she has been doing well with her anxiety and OCD.  She reports that her anxiety and OCD has been manageable.  She reports that increased dose of Prozac has been helpful and she has tolerated treatment well.  She denies any new psychosocial stressors.  She reports that she has been talking to her counselor about treatment for transgender health.  We discussed about grades at [employer] and also discussed to reach out to PCP to see if they can refer her to a local clinic for her transgender treatment.  She verbalized understanding.  She reports that things are going well with parents.  She also reports that she has gotten a car and has been driving.   Visit Diagnosis:    ICD-10-CM   1. Recurrent major depressive disorder, in partial remission (HCC)  F33.41   2. Mixed obsessional thoughts and acts  F42.2   3. Other specified anxiety disorders  F41.8     Past Psychiatric History:reviewed today no change from last visit, psychiatric hospitalization at [facility] for a week at the beginning of August. Prozac was increased to 60 mg once a day after the last visit.  She also started seeing Mr. Bynum Bellows for ind therapy.   Past Medical History:  Past Medical History:  Diagnosis Date  .  Abdominal migraine   . Medical history non-contributory     Past Surgical History:  Procedure Laterality Date  . WISDOM TOOTH EXTRACTION      Family Psychiatric History: reviewed today, no change    Family History:  Family History  Problem Relation Age of Onset  . ADD / ADHD Brother     Social History:  Social History   Socioeconomic History  . Marital status: Single    Spouse name: Not on file  . Number of children: 0  . Years of education: Not on  file  . Highest education level: 12th grade  Occupational History  . Not on file  Tobacco Use  . Smoking status: Never Smoker  . Smokeless tobacco: Never Used  Substance and Sexual Activity  . Alcohol use: No  . Drug use: No  . Sexual activity: Not Currently  Other Topics Concern  . Not on file  Social History Narrative  . Not on file   Social Determinants of Health   Financial Resource Strain: Low Risk   . Difficulty of Paying Living Expenses: Not hard at all  Food Insecurity: No Food Insecurity  . Worried About Programme researcher, broadcasting/film/video in the Last Year: Never true  . Ran Out of Food in the Last Year: Never true  Transportation Needs: Unmet Transportation Needs  . Lack of Transportation (Medical): Yes  . Lack of Transportation (Non-Medical): Yes  Physical Activity: Inactive  . Days of Exercise per Week: 0 days  . Minutes of Exercise per Session: 0 min  Stress: Stress Concern Present  . Feeling of Stress : Very much  Social Connections: Unknown  . Frequency of Communication with Friends and Family: Not on file  . Frequency of Social Gatherings with Friends and Family: Not on file  . Attends Religious Services: Never  . Active Member of Clubs or Organizations: Yes  . Attends Banker Meetings: 1 to 4 times per year  . Marital Status: Never married    Allergies:  Allergies  Allergen Reactions  . Cefzil [Cefprozil]     Diarrhea    Metabolic Disorder Labs: Lab Results  Component Value Date   HGBA1C 5.3 06/28/2019   MPG 105.41 06/28/2019   Lab Results  Component Value Date   PROLACTIN 17.5 (H) 06/28/2019   Lab Results  Component Value Date   CHOL 197 (H) 06/28/2019   TRIG 87 06/28/2019   HDL 29 (L) 06/28/2019   CHOLHDL 6.8 06/28/2019   VLDL 17 06/28/2019   LDLCALC 151 (H) 06/28/2019   Lab Results  Component Value Date   TSH 1.930 06/22/2019    Therapeutic Level Labs: No results found for: LITHIUM No results found for: VALPROATE No components  found for:  CBMZ  Current Medications: Current Outpatient Medications  Medication Sig Dispense Refill  . FLUoxetine (PROZAC) 20 MG capsule Take 1 capsule (20 mg total) by mouth daily. To be combined with Prozac 40 mg daily. 30 capsule 1  . FLUoxetine (PROZAC) 40 MG capsule TAKE 1 CAPSULE BY MOUTH EVERY DAY 30 capsule 1  . hydrOXYzine (ATARAX/VISTARIL) 25 MG tablet Take 1 tablet (25 mg total) by mouth at bedtime as needed for anxiety (sleep). 30 tablet 1   No current facility-administered medications for this visit.     Musculoskeletal: Strength & Muscle Tone: unable to assess since visit was over the telemedicine. Gait & Station:unable to assess since visit was over the telemedicine. Patient leans: N/A  Psychiatric Specialty Exam: ROSReview of 12  systems negative except as mentioned in HPI   Mental Status Exam: Appearance: casually dressed; fairly groomed; no overt signs of trauma or distress noted Attitude: calm, cooperative with good eye contact Activity: No PMA/PMR, no tics/no tremors; no EPS noted  Speech: prolonged speech latency, normal volume Thought Process: Logical, linear, and goal-directed.  Associations: no looseness, tangentiality, circumstantiality, flight of ideas, thought blocking or word salad noted Thought Content: (abnormal/psychotic thoughts): no abnormal or delusional thought process evidenced SI/HI: denies Si/Hi Perception: no illusions or visual/auditory hallucinations noted; no response to internal stimuli demonstrated  Mood & Affect: "happy"/full range, neutral Judgment & Insight: both fair Attention and Concentration : Good Cognition : WNL Language : Good ADL - Intact   Assessment and Plan:   # Depression (recurrent, in remission) - Continue Prozac 60 mg daily - Therapy scheduled with Mr Sheets every other week.   - Labs from inpatient previously reviewed, CBC and CMP stable, Cholesterol 197 with HDL 29 and LDL 151; HbA1C 5.3 and TSH - WNL.     # Anxiety (chronic and stable) - His reports are most consistent with generalized and social anxiety disorder with panic attacks.  - Same as mentioned above.   # OCD (chronic and stable) - His reports are most consistent with OCD.  - Appears to be improving.   # Asperger's syndrome/ASD (chronic) - Mother's report of some sensory issues, difficulties with transition when he was young, concreteness, concerning for Asperger's syndrome. - Recommended [facility] for ASD testing to mother.   # Gender dysphoria - at present would like to use she/her pronoun and name - Raquel Sarna - parents are aware and appears supportive - Recommended to speak with PCP for referral to endocrine for gender dysphoria, also discussed about the other resources in the area.   # Abdominal migraine - There has been some concerns for AIP due to hx of abdominal migraine which are described as intermittent episodes of uneasiness in stomach and moderate to severe abdominal pain accompanied with mild numbness in his body, bloating. His father has similar hx and perhaps worse attacks than him. Spoke with [facility] representative previously on their physician consultation line. He is referred to GI by PCP - but they have not followed up. He had one mild episode about two weeks ago. Discussed to follow up on GI referral.   Mother phone  - [phone]  30 minutes total time for encounter today which included chart review, pt evaluation, collaterals, counseling and coordination of care with pt regarding medication and other treatment discussions, medication orders and charting.      Orlene Erm, MD  Orlene Erm, MD 01/12/20 10:30 AM

## 2020-02-08 ENCOUNTER — Other Ambulatory Visit: Payer: Self-pay | Admitting: Child and Adolescent Psychiatry

## 2020-02-08 DIAGNOSIS — F422 Mixed obsessional thoughts and acts: Secondary | ICD-10-CM

## 2020-02-08 DIAGNOSIS — F418 Other specified anxiety disorders: Secondary | ICD-10-CM

## 2020-03-14 ENCOUNTER — Other Ambulatory Visit: Payer: Self-pay

## 2020-03-14 ENCOUNTER — Telehealth (INDEPENDENT_AMBULATORY_CARE_PROVIDER_SITE_OTHER): Payer: 59 | Admitting: Child and Adolescent Psychiatry

## 2020-03-14 DIAGNOSIS — F418 Other specified anxiety disorders: Secondary | ICD-10-CM

## 2020-03-14 DIAGNOSIS — F422 Mixed obsessional thoughts and acts: Secondary | ICD-10-CM

## 2020-03-14 DIAGNOSIS — F3341 Major depressive disorder, recurrent, in partial remission: Secondary | ICD-10-CM

## 2020-03-14 DIAGNOSIS — F330 Major depressive disorder, recurrent, mild: Secondary | ICD-10-CM

## 2020-03-14 MED ORDER — FLUOXETINE HCL 20 MG PO CAPS: 20.0000 mg | ORAL_CAPSULE | Freq: Every day | ORAL | 1 refills | Status: DC

## 2020-03-14 NOTE — Progress Notes (Signed)
Virtual Visit via Telephone Note  I connected with Richard Raymond on 03/14/20 at 10:00 AM EDT by telephone and verified that I am speaking with the correct person using two identifiers.  Location: Patient: home Provider: office   I discussed the limitations, risks, security and privacy concerns of performing an evaluation and management service by telephone and the availability of in person appointments. I also discussed with the patient that there may be a patient responsible charge related to this service. The patient expressed understanding and agreed to proceed.    I discussed the assessment and treatment plan with the patient. The patient was provided an opportunity to ask questions and all were answered. The patient agreed with the plan and demonstrated an understanding of the instructions.   The patient was advised to call back or seek an in-person evaluation if the symptoms worsen or if the condition fails to improve as anticipated.  I provided 30 minutes of non-face-to-face time during this encounter.   [name], MD     [facility] MD/PA/NP OP Progress Note 03/14/20 10:00 AM Richard Raymond  MRN:  [id number]  Synopsis: This is a 19 year old Caucasian, assigned male at birth, currently identifying his transgender male preferring for now she/her and name "Richard Raymond".    Chief Complaint: Medication management follow-up for depression, anxiety, OCD.  HPI: Richard Raymond was seen and evaluated over telemedicine encounter partly on the video and partly on the phone because of poor connectivity.  She reports that she had discontinued Prozac because she believed Prozac was taking away her creativity.  She reports that she discontinued medication about a month ago, and since then she feels that she had reverted back to her previous self which was more creative and passionate about music.  She reports that with the discontinuation of medication she had noticed slight worsening of her depressive symptoms  but it is not anyway close to how it was when she first started following up with this provider. She denies anhedonia, problems with sleep, denies thoughts of suicide or self-harm.  She reports that their anxiety and OCD also has slightly worsened since the discontinuation of Prozac.  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  She reports that she had been missing appointment with her therapist and therefore has not followed up since last 2 months.  She was strongly recommended to restart therapy, to which she verbalized understanding.  Writer discussed about risks of being off of medication including relapse/worsening of depression, anxiety and OCD.  She verbalized understanding and agrees to start Prozac at a low dose at 20 mg once a day.   Visit Diagnosis:    ICD-10-CM   1. Mild episode of recurrent major depressive disorder (HCC)  F33.0 FLUoxetine (PROZAC) 20 MG capsule  2. Mixed obsessional thoughts and acts  F42.2 FLUoxetine (PROZAC) 20 MG capsule  3. Other specified anxiety disorders  F41.8 FLUoxetine (PROZAC) 20 MG capsule    Past Psychiatric History:reviewed today no change from last visit, psychiatric hospitalization at [facility] for a week at the beginning of August. Discontinued Prozac 60 mg daily by self in 01/2020, back on 20 mg daily from 02/2020.  She also started seeing Mr. Bynum Bellows for ind therapy.   Past Medical History:  Past Medical History:  Diagnosis Date  . Abdominal migraine   .  Medical history non-contributory     Past Surgical History:  Procedure Laterality Date  . WISDOM TOOTH EXTRACTION      Family Psychiatric History: reviewed today, no change    Family History:  Family History  Problem Relation Age of Onset  . ADD / ADHD Brother     Social History:  Social History   Socioeconomic History  .  Marital status: Single    Spouse name: Not on file  . Number of children: 0  . Years of education: Not on file  . Highest education level: 12th grade  Occupational History  . Not on file  Tobacco Use  . Smoking status: Never Smoker  . Smokeless tobacco: Never Used  Substance and Sexual Activity  . Alcohol use: No  . Drug use: No  . Sexual activity: Not Currently  Other Topics Concern  . Not on file  Social History Narrative  . Not on file   Social Determinants of Health   Financial Resource Strain: Low Risk   . Difficulty of Paying Living Expenses: Not hard at all  Food Insecurity: No Food Insecurity  . Worried About Charity fundraiser in the Last Year: Never true  . Ran Out of Food in the Last Year: Never true  Transportation Needs: Unmet Transportation Needs  . Lack of Transportation (Medical): Yes  . Lack of Transportation (Non-Medical): Yes  Physical Activity: Inactive  . Days of Exercise per Week: 0 days  . Minutes of Exercise per Session: 0 min  Stress: Stress Concern Present  . Feeling of Stress : Very much  Social Connections: Unknown  . Frequency of Communication with Friends and Family: Not on file  . Frequency of Social Gatherings with Friends and Family: Not on file  . Attends Religious Services: Never  . Active Member of Clubs or Organizations: Yes  . Attends Archivist Meetings: 1 to 4 times per year  . Marital Status: Never married    Allergies:  Allergies  Allergen Reactions  . Cefzil [Cefprozil]     Diarrhea    Metabolic Disorder Labs: Lab Results  Component Value Date   HGBA1C 5.3 06/28/2019   MPG 105.41 06/28/2019   Lab Results  Component Value Date   PROLACTIN 17.5 (H) 06/28/2019   Lab Results  Component Value Date   CHOL 197 (H) 06/28/2019   TRIG 87 06/28/2019   HDL 29 (L) 06/28/2019   CHOLHDL 6.8 06/28/2019   VLDL 17 06/28/2019   LDLCALC 151 (H) 06/28/2019   Lab Results  Component Value Date   TSH 1.930 06/22/2019     Therapeutic Level Labs: No results found for: LITHIUM No results found for: VALPROATE No components found for:  CBMZ  Current Medications: Current Outpatient Medications  Medication Sig Dispense Refill  . FLUoxetine (PROZAC) 20 MG capsule Take 1 capsule (20 mg total) by mouth daily. To be combined with Prozac 40 mg daily. 30 capsule 1  . hydrOXYzine (ATARAX/VISTARIL) 25 MG tablet TAKE 1 TABLET (25 MG TOTAL) BY MOUTH AT BEDTIME AS NEEDED FOR ANXIETY (SLEEP). 90 tablet 1   No current facility-administered medications for this visit.     Musculoskeletal: Strength & Muscle Tone:unable to assess since visit was over the telemedicine. Gait & Station:unable to assess since visit was over the telemedicine. Patient leans: N/A  Psychiatric Specialty Exam: ROSReview of 12 systems negative except as mentioned in HPI   Mental Status Exam: Appearance: casually dressed; fairly groomed with long hair; no overt signs  of trauma or distress noted Attitude: Cooperative with fair eye contact Activity: No PMA/PMR, no tics/no tremors; no EPS noted  Speech: decreased rate, normal rhythm and volume Thought Process: Linear and concrete Associations: no looseness, tangentiality, circumstantiality, flight of ideas, thought blocking or word salad noted Thought Content: (abnormal/psychotic thoughts): no abnormal or delusional thought process evidenced SI/HI: Denies of Si/Hi Perception: no illusions or visual/auditory hallucinations noted; no response to internal stimuli demonstrated Mood & Affect: "good"/full range, neutral Judgment & Insight: both fair Attention and Concentration : Good Cognition : WNL Language : Good ADL - Intact    Assessment and Plan:   # Depression (recurrent, mild) - Restart Prozac 20 mg daily - Recommended to call current therapist and restart Therapy with Mr Sheets every other week.   - Labs from inpatient previously reviewed, CBC and CMP stable, Cholesterol 197 with  HDL 29 and LDL 151; HbA1C 5.3 and TSH - WNL.    # Anxiety (chronic and slightly worse) - His reports are most consistent with generalized and social anxiety disorder with panic attacks.  - Same as mentioned above.   # OCD (chronic and stable) - His reports are most consistent with OCD.    # Asperger's syndrome/ASD (chronic) - Mother's report of some sensory issues, difficulties with transition when he was young, concreteness, concerning for Asperger's syndrome. - Recommended [facility] for ASD testing to mother previous, they have not follow up.   # Gender dysphoria - at present would like to use she/her pronoun and name - Richard Raymond - parents are aware and appears supportive - Recommended to speak with PCP for referral to endocrine for gender dysphoria, also discussed about the other resources in the area. She has not followed on this.   # Abdominal migraine - There has been some concerns for AIP due to hx of abdominal migraine which are described as intermittent episodes of uneasiness in stomach and moderate to severe abdominal pain accompanied with mild numbness in his body, bloating. Her father has similar hx and perhaps worse attacks than him. Spoke with [facility] representative previously on their physician consultation line. She is referred to GI by PCP - but they have not followed up.       [name], MD  [name], MD 03/14/20 10:30 AM

## 2020-03-15 ENCOUNTER — Encounter: Payer: Self-pay | Admitting: Child and Adolescent Psychiatry

## 2020-05-02 ENCOUNTER — Ambulatory Visit (INDEPENDENT_AMBULATORY_CARE_PROVIDER_SITE_OTHER): Payer: Managed Care, Other (non HMO) | Admitting: Family Medicine

## 2020-05-02 ENCOUNTER — Encounter: Payer: Self-pay | Admitting: Family Medicine

## 2020-05-02 ENCOUNTER — Other Ambulatory Visit: Payer: Self-pay

## 2020-05-02 VITALS — BP 100/70 | Temp 98.0°F | Wt 213.2 lb

## 2020-05-02 DIAGNOSIS — F840 Autistic disorder: Secondary | ICD-10-CM

## 2020-05-02 NOTE — Progress Notes (Signed)
   Subjective:    Patient ID: Richard Raymond, adult    DOB: [birth year], 19 y.o.   MRN: [id number]  HPI Patient and mom- [name] come in today to discuss testing for autism as recommended by Dr. Jerold Coombe. Behavioral health notes were reviewed I did talk with the patient today about whether or not he wants to see endocrinology he does not want to see them currently He does want to see behavioral autism specialist at [facility] for further evaluation and testing  Review of Systems  Constitutional: Negative for activity change, appetite change and fatigue.  HENT: Negative for congestion and rhinorrhea.   Respiratory: Negative for cough and shortness of breath.   Cardiovascular: Negative for chest pain and leg swelling.  Gastrointestinal: Negative for abdominal pain, nausea and vomiting.  Neurological: Negative for dizziness and headaches.  Psychiatric/Behavioral: Negative for agitation and behavioral problems.       Objective:   Physical Exam Vitals reviewed.  Constitutional:      General: She is not in acute distress. HENT:     Head: Normocephalic.  Cardiovascular:     Rate and Rhythm: Normal rate and regular rhythm.     Heart sounds: Normal heart sounds. No murmur.  Pulmonary:     Effort: Pulmonary effort is normal.     Breath sounds: Normal breath sounds.  Lymphadenopathy:     Cervical: No cervical adenopathy.  Neurological:     Mental Status: She is alert.  Psychiatric:        Behavior: Behavior normal.    Very nice patient We did discuss her gender identity.  Supportive of this measure       Assessment & Plan:  1. Autism spectrum disorder It is hard to know if patient has autism but I do believe it would be wise to refer to specialist at [facility].  Family is supportive of this.  We will move forward with this - [facility]

## 2020-05-16 ENCOUNTER — Other Ambulatory Visit: Payer: Self-pay

## 2020-05-16 ENCOUNTER — Telehealth (INDEPENDENT_AMBULATORY_CARE_PROVIDER_SITE_OTHER): Payer: 59 | Admitting: Child and Adolescent Psychiatry

## 2020-05-16 DIAGNOSIS — F418 Other specified anxiety disorders: Secondary | ICD-10-CM | POA: Diagnosis not present

## 2020-05-16 DIAGNOSIS — F422 Mixed obsessional thoughts and acts: Secondary | ICD-10-CM | POA: Diagnosis not present

## 2020-05-16 DIAGNOSIS — F330 Major depressive disorder, recurrent, mild: Secondary | ICD-10-CM

## 2020-05-16 MED ORDER — FLUOXETINE HCL 20 MG PO CAPS: 20.0000 mg | ORAL_CAPSULE | Freq: Every day | ORAL | 1 refills | Status: DC
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
## 2020-05-23 ENCOUNTER — Encounter: Payer: Self-pay | Admitting: Family Medicine

## 2020-05-23 ENCOUNTER — Other Ambulatory Visit: Payer: Self-pay

## 2020-05-23 ENCOUNTER — Ambulatory Visit (INDEPENDENT_AMBULATORY_CARE_PROVIDER_SITE_OTHER): Payer: Managed Care, Other (non HMO) | Admitting: Family Medicine

## 2020-05-23 VITALS — BP 110/72 | Temp 98.0°F | Ht 64.0 in | Wt 209.0 lb

## 2020-05-23 DIAGNOSIS — Z1341 Encounter for autism screening: Secondary | ICD-10-CM | POA: Diagnosis not present

## 2020-05-23 DIAGNOSIS — F649 Gender identity disorder, unspecified: Secondary | ICD-10-CM | POA: Diagnosis not present

## 2020-05-23 NOTE — Progress Notes (Signed)
   Subjective:    Patient ID: Richard Raymond, adult    DOB: January 16, 2001, 19 y.o.   MRN: [id number]  HPIfollow up on the concern for autism.  The patient is seeing psychiatry for depression issues.  He is interested in seeing endocrinology for possible gender medication changes Also interested in seeing behavioral science for evaluation of the possibility of autism Patient has very little initiative He does work part-time at [employer] He does not do any type of activity currently that is progressing toward being able to be independent Discussion held with the gentleman regarding looking at potential for training for a trade or potential schooling There is a possibility that there could be a underlying disability related to autism spectrum so therefore is seeing a specialist for further evaluation is reasonable    Review of Systems  Constitutional: Negative for activity change, appetite change and fatigue.  HENT: Negative for congestion.   Respiratory: Negative for cough.   Cardiovascular: Negative for chest pain.  Gastrointestinal: Negative for abdominal pain.  Skin: Negative for color change.  Neurological: Negative for headaches.  Psychiatric/Behavioral: Negative for behavioral problems.       Objective:   Physical Exam Vitals reviewed.  Constitutional:      General: She is not in acute distress. HENT:     Head: Normocephalic.  Cardiovascular:     Rate and Rhythm: Normal rate and regular rhythm.     Heart sounds: Normal heart sounds. No murmur heard.   Pulmonary:     Effort: Pulmonary effort is normal.     Breath sounds: Normal breath sounds.  Lymphadenopathy:     Cervical: No cervical adenopathy.  Neurological:     Mental Status: She is alert.  Psychiatric:        Behavior: Behavior normal.           Assessment & Plan:  Behavioral issues possible autism-trying to get him in at [facility] for further evaluation if unable to do so next that would be [facility] for further  evaluation of possible undiagnosed autism or autism spectrum  Depression along with transgender stresses-continue with psychiatrist.  Recommend to do some counseling.  Referral to endocrinology patient interested in further testing regarding hormones and possible XY issues

## 2020-06-02 ENCOUNTER — Encounter: Payer: Self-pay | Admitting: Family Medicine

## 2020-06-19 ENCOUNTER — Telehealth: Payer: Self-pay | Admitting: Family Medicine

## 2020-06-19 NOTE — Telephone Encounter (Signed)
Pt's mom is calling checking status of autism referral.   CB # [phone] or [phone]

## 2020-06-23 NOTE — Telephone Encounter (Signed)
Mailed info to pt, waiting on return call from [facility], explained that they may get further than I did, sent # for pt to call

## 2020-07-05 ENCOUNTER — Telehealth: Payer: Self-pay | Admitting: *Deleted

## 2020-07-05 NOTE — Telephone Encounter (Signed)
Patient scheduled. Will call back if feeling better tomorrow.

## 2020-07-05 NOTE — Telephone Encounter (Signed)
Lmtc

## 2020-07-05 NOTE — Telephone Encounter (Signed)
If still having significant symptoms tomorrow certainly being seen would be a good idea either here or if worse ER

## 2020-07-06 ENCOUNTER — Ambulatory Visit: Payer: Managed Care, Other (non HMO) | Admitting: Family Medicine

## 2020-07-10 ENCOUNTER — Telehealth (INDEPENDENT_AMBULATORY_CARE_PROVIDER_SITE_OTHER): Payer: 59 | Admitting: Child and Adolescent Psychiatry

## 2020-07-10 ENCOUNTER — Encounter: Payer: Self-pay | Admitting: Child and Adolescent Psychiatry

## 2020-07-10 ENCOUNTER — Other Ambulatory Visit: Payer: Self-pay

## 2020-07-10 DIAGNOSIS — F422 Mixed obsessional thoughts and acts: Secondary | ICD-10-CM | POA: Diagnosis not present

## 2020-07-10 DIAGNOSIS — F3341 Major depressive disorder, recurrent, in partial remission: Secondary | ICD-10-CM

## 2020-07-10 DIAGNOSIS — F418 Other specified anxiety disorders: Secondary | ICD-10-CM | POA: Diagnosis not present

## 2020-07-10 MED ORDER — FLUOXETINE HCL 20 MG PO CAPS: 20.0000 mg | ORAL_CAPSULE | Freq: Every day | ORAL | 1 refills | Status: DC

## 2020-07-10 NOTE — Progress Notes (Signed)
Virtual Visit via Telephone Note  I connected with Richard Raymond on 07/10/20 at  2:00 PM EDT by telephone and verified that I am speaking with the correct person using two identifiers.  Location: Patient: home Provider: office   I discussed the limitations, risks, security and privacy concerns of performing an evaluation and management service by telephone and the availability of in person appointments. I also discussed with the patient that there may be a patient responsible charge related to this service. The patient expressed understanding and agreed to proceed.    I discussed the assessment and treatment plan with the patient. The patient was provided an opportunity to ask questions and all were answered. The patient agreed with the plan and demonstrated an understanding of the instructions.   The patient was advised to call back or seek an in-person evaluation if the symptoms worsen or if the condition fails to improve as anticipated.  I provided 30 minutes of non-face-to-face time during this encounter.   [name], MD       [facility] MD/PA/NP OP Progress Note 03/14/20 10:00 AM Richard Raymond  MRN:  [id number]  Synopsis: This is a 19 year old Caucasian, assigned male at birth, currently identifying his transgender male preferring for now she/her and name "Richard Raymond".    Chief Complaint: Medication management follow-up  HPI: Richard Raymond was seen and evaluated over telephone encounter for medication management follow-up.  In the interim since last appointment she was seen by PCP who made a referral for autism evaluation and also made a referral for endocrinology for gender dysphoria.  She also had an episode of abdominal migraine for about 3 days with symptoms of diarrhea, vomiting, sweating, which spontaneously resolved.  Richard Raymond reports that she has been doing well, denies any new concerns for today's appointment.  She reports that she will be starting [employer].  She reports that she will be starting college tomorrow and looking forward to it.  She reports that she has stopped working at [employer] which has helped her physical and mental health.  She reports that she is looking for other jobs and grocery stores.  In regards of anxiety she reports that her anxiety despite all this changes has been stable and denies any problems with mood and reports that her mood has been "decent" and denies any lows.  She denies problems with sleep, appetite.  She reports that she does feel tired but she has good enough energy to get through her day.  She reports that she has not been compliant to her Prozac and takes about 3 out of 7 days a week.  She asked if this would be a good time to discontinue her Prozac.  Writer provided psychoeducation on medication and recommended to continue Prozac at this time.  She verbalized understanding and agreed with the plan.  She reports that she has not seen her therapist recently, and was recommended to make an appointment.  She reports that she has been spending time playing video games, doing her chores, making music and enjoys his activities.     Visit Diagnosis:    ICD-10-CM   1. Recurrent major depressive disorder, in partial remission (HCC)  F33.41 FLUoxetine (PROZAC) 20 MG capsule  2. Mixed obsessional thoughts and acts  F42.2 FLUoxetine (PROZAC) 20 MG capsule  3. Other specified anxiety disorders  F41.8 FLUoxetine (PROZAC) 20 MG capsule    Past Psychiatric History:reviewed today no change from last visit, psychiatric hospitalization at [facility] for  a week at the beginning of August. Discontinued Prozac 60 mg daily by self in 01/2020, back on 20 mg daily from 02/2020 but only partially compliant.  She also started seeing Mr. Bynum Bellows for ind therapy and non compliant.   Past Medical History:  Past Medical History:  Diagnosis Date  . Abdominal migraine   . Medical history non-contributory     Past Surgical History:   Procedure Laterality Date  . WISDOM TOOTH EXTRACTION      Family Psychiatric History: reviewed today, no change    Family History:  Family History  Problem Relation Age of Onset  . ADD / ADHD Brother     Social History:  Social History   Socioeconomic History  . Marital status: Single    Spouse name: Not on file  . Number of children: 0  . Years of education: Not on file  . Highest education level: 12th grade  Occupational History  . Not on file  Tobacco Use  . Smoking status: Never Smoker  . Smokeless tobacco: Never Used  Vaping Use  . Vaping Use: Never used  Substance and Sexual Activity  . Alcohol use: No  . Drug use: No  . Sexual activity: Not Currently  Other Topics Concern  . Not on file  Social History Narrative  . Not on file   Social Determinants of Health   Financial Resource Strain:   . Difficulty of Paying Living Expenses:   Food Insecurity:   . Worried About Programme researcher, broadcasting/film/video in the Last Year:   . Barista in the Last Year:   Transportation Needs:   . Freight forwarder (Medical):   Marland Kitchen Lack of Transportation (Non-Medical):   Physical Activity:   . Days of Exercise per Week:   . Minutes of Exercise per Session:   Stress:   . Feeling of Stress :   Social Connections:   . Frequency of Communication with Friends and Family:   . Frequency of Social Gatherings with Friends and Family:   . Attends Religious Services:   . Active Member of Clubs or Organizations:   . Attends Banker Meetings:   Marland Kitchen Marital Status:     Allergies:  Allergies  Allergen Reactions  . Cefzil [Cefprozil]     Diarrhea    Metabolic Disorder Labs: Lab Results  Component Value Date   HGBA1C 5.3 06/28/2019   MPG 105.41 06/28/2019   Lab Results  Component Value Date   PROLACTIN 17.5 (H) 06/28/2019   Lab Results  Component Value Date   CHOL 197 (H) 06/28/2019   TRIG 87 06/28/2019   HDL 29 (L) 06/28/2019   CHOLHDL 6.8 06/28/2019   VLDL  17 06/28/2019   LDLCALC 151 (H) 06/28/2019   Lab Results  Component Value Date   TSH 1.930 06/22/2019    Therapeutic Level Labs: No results found for: LITHIUM No results found for: VALPROATE No components found for:  CBMZ  Current Medications: Current Outpatient Medications  Medication Sig Dispense Refill  . FLUoxetine (PROZAC) 20 MG capsule Take 1 capsule (20 mg total) by mouth daily. 30 capsule 1  . hydrOXYzine (ATARAX/VISTARIL) 25 MG tablet TAKE 1 TABLET (25 MG TOTAL) BY MOUTH AT BEDTIME AS NEEDED FOR ANXIETY (SLEEP). 90 tablet 1   No current facility-administered medications for this visit.     Musculoskeletal: Strength & Muscle Tone:unable to assess since visit was over the telemedicine. Gait & Station:unable to assess since visit was over  the telemedicine. Patient leans: N/A  Psychiatric Specialty Exam: ROSReview of 12 systems negative except as mentioned in HPI   Mental Status Exam: Appearance: unable to assess since virtual visit was over the telephone Attitude: calm, cooperative  Activity: unable to assess since virtual visit was over the telephone Speech: normal rate, rhythm and volume Thought Process: Logical, linear, and goal-directed.  Associations: no looseness, tangentiality, circumstantiality, flight of ideas, thought blocking or word salad noted Thought Content: (abnormal/psychotic thoughts): no abnormal or delusional thought process evidenced SI/HI: denies Si/Hi Perception: no illusions or visual/auditory hallucinations noted; Mood & Affect: "good"/unable to assess since virtual visit was over the telephone  Judgment & Insight: both fair Attention and Concentration : Good Cognition : WNL Language : Good ADL - Intact      Assessment and Plan:   # Depression (recurrent, remission) - Continue Prozac 20 mg daily, psychoeducation on med adherence provided as mentioned above.  - Recommended to call current therapist and restart Therapy with Mr  Sheets every other week.   - Labs from inpatient previously reviewed, CBC and CMP stable, Cholesterol 197 with HDL 29 and LDL 151; HbA1C 5.3 and TSH - WNL.    # Anxiety (chronic and stable) - His reports are most consistent with generalized and social anxiety disorder with panic attacks.  - Same as mentioned above.   # OCD (chronic and stable) - His reports are most consistent with OCD.    # Asperger's syndrome/ASD (chronic) - Mother's report of some sensory issues, difficulties with transition when he was young, concreteness, concerning for Asperger's syndrome. -  They saw Dr. Gerda Diss recently and he appears to have referred him to [facility] for testing.  # Gender dysphoria - at present would like to use she/her pronoun and name - Richard Raymond - parents are aware and appears supportive - Referred to endocrine by PCP  # Abdominal migraine - There has been some concerns for AIP due to hx of abdominal migraine which are described as intermittent episodes of uneasiness in stomach and moderate to severe abdominal pain accompanied with mild numbness in his body, bloating. Her father has similar hx and perhaps worse attacks than him. Spoke with porphyria foundation representative previously on their physician consultation line. She is referred to GI by PCP - but they have not followed up. Does not appear interested in this. .       This note was generated in part or whole with voice recognition software. Voice recognition is usually quite accurate but there are transcription errors that can and very often do occur. I apologize for any typographical errors that were not detected and corrected.    [name], MD   07/10/20  2:30 pm

## 2020-08-23 ENCOUNTER — Ambulatory Visit: Payer: Managed Care, Other (non HMO) | Admitting: Family Medicine

## 2020-08-31 ENCOUNTER — Other Ambulatory Visit: Payer: Self-pay

## 2020-08-31 ENCOUNTER — Encounter: Payer: Self-pay | Admitting: Child and Adolescent Psychiatry

## 2020-08-31 ENCOUNTER — Telehealth (INDEPENDENT_AMBULATORY_CARE_PROVIDER_SITE_OTHER): Payer: 59 | Admitting: Child and Adolescent Psychiatry

## 2020-08-31 DIAGNOSIS — F3341 Major depressive disorder, recurrent, in partial remission: Secondary | ICD-10-CM

## 2020-08-31 DIAGNOSIS — F422 Mixed obsessional thoughts and acts: Secondary | ICD-10-CM | POA: Diagnosis not present

## 2020-08-31 DIAGNOSIS — F418 Other specified anxiety disorders: Secondary | ICD-10-CM | POA: Diagnosis not present

## 2020-08-31 NOTE — Progress Notes (Signed)
Virtual Visit via Telephone Note  I connected with Richard Raymond on 08/31/20 at  2:30 PM EDT by telephone and verified that I am speaking with the correct person using two identifiers.  Location: Patient: home Provider: office   I discussed the limitations, risks, security and privacy concerns of performing an evaluation and management service by telephone and the availability of in person appointments. I also discussed with the patient that there may be a patient responsible charge related to this service. The patient expressed understanding and agreed to proceed.    I discussed the assessment and treatment plan with the patient. The patient was provided an opportunity to ask questions and all were answered. The patient agreed with the plan and demonstrated an understanding of the instructions.   The patient was advised to call back or seek an in-person evaluation if the symptoms worsen or if the condition fails to improve as anticipated.  I provided 30 minutes of non-face-to-face time during this encounter.   [name], MD       [facility] MD/PA/NP OP Progress Note 08/31/20 2:30 pm Baylin Hennessee  MRN:  [id number]  Synopsis: This is a 19 year old Caucasian, assigned male at birth, currently identifying his transgender male preferring for now she/her and name "Richard Raymond".    Chief Complaint: Medication management follow-up  HPI:   Richard Raymond was evaluated over telephone encounter for medication management follow-up.  In the interim since last appointment she had 1 appointment with autism evaluation clinic but she does not know the name of the clinic that she had an appointment with.    She reports that she is attending [place] [employer], and so far her lowest grade is 88.  She reports that college is been going well and she has been well enough with the college.  In regards of mood she reports that her mood is 6 or 7 out of 10(10 = best mood) on most days and has  occasional irritability and disappointed mood.  She denies any low lows or depressed mood.  She denies any thoughts of suicide or self-harm.  She denies anhedonia and reports that she has been spending time with doing her college work, playing video games.  She reports that her sleep ranges from 4 to 10 hours and depends on if she has college or college work on that day.  She denies using hydroxyzine and does not believe the need for any medications at this time.  She reports that she has been spending a lot more time taking care of herself which includes taking shower, eating regularly etc.  She reports that her anxiety has been minimal despite starting college and rates her anxiety at 2 or 3 out of 10(10 = most anxious).  She also denies any intrusive or obsessive thoughts and compulsive behaviors that she had before.  She reports that she has stopped taking her Prozac about a month ago.  She reports that she started to forget about them and therefore she has not been taking it.  We discussed that she has history of recurrent depression and for maintenance treatment would recommend continuing Prozac 20 mg once a day.  She verbalized understanding and agreed with the plan.  She denies any concerns for today's appointment.  We discussed about follow-up and 6 weeks or earlier if needed.  She is also recommended to make a follow-up appointment for therapy for supportive counseling.  She verbalized understanding. She reports having enough meds and a refill on  Prozac.    Visit Diagnosis:    ICD-10-CM   1. Recurrent major depressive disorder, in partial remission (HCC)  F33.41   2. Mixed obsessional thoughts and acts  F42.2   3. Other specified anxiety disorders  F41.8     Past Psychiatric History:reviewed today no change from last visit, psychiatric hospitalization at [facility] for a week at the beginning of August. Discontinued Prozac 60 mg daily by self in 01/2020, back on 20 mg daily from 02/2020 but only  partially compliant.  She also started seeing Mr. Bynum Bellows for ind therapy and non compliant.   Past Medical History:  Past Medical History:  Diagnosis Date  . Abdominal migraine   . Medical history non-contributory     Past Surgical History:  Procedure Laterality Date  . WISDOM TOOTH EXTRACTION      Family Psychiatric History: reviewed today, no change    Family History:  Family History  Problem Relation Age of Onset  . ADD / ADHD Brother     Social History:  Social History   Socioeconomic History  . Marital status: Single    Spouse name: Not on file  . Number of children: 0  . Years of education: Not on file  . Highest education level: 12th grade  Occupational History  . Not on file  Tobacco Use  . Smoking status: Never Smoker  . Smokeless tobacco: Never Used  Vaping Use  . Vaping Use: Never used  Substance and Sexual Activity  . Alcohol use: No  . Drug use: No  . Sexual activity: Not Currently  Other Topics Concern  . Not on file  Social History Narrative  . Not on file   Social Determinants of Health   Financial Resource Strain:   . Difficulty of Paying Living Expenses: Not on file  Food Insecurity:   . Worried About Programme researcher, broadcasting/film/video in the Last Year: Not on file  . Ran Out of Food in the Last Year: Not on file  Transportation Needs:   . Lack of Transportation (Medical): Not on file  . Lack of Transportation (Non-Medical): Not on file  Physical Activity:   . Days of Exercise per Week: Not on file  . Minutes of Exercise per Session: Not on file  Stress:   . Feeling of Stress : Not on file  Social Connections:   . Frequency of Communication with Friends and Family: Not on file  . Frequency of Social Gatherings with Friends and Family: Not on file  . Attends Religious Services: Not on file  . Active Member of Clubs or Organizations: Not on file  . Attends Banker Meetings: Not on file  . Marital Status: Not on file     Allergies:  Allergies  Allergen Reactions  . Cefzil [Cefprozil]     Diarrhea    Metabolic Disorder Labs: Lab Results  Component Value Date   HGBA1C 5.3 06/28/2019   MPG 105.41 06/28/2019   Lab Results  Component Value Date   PROLACTIN 17.5 (H) 06/28/2019   Lab Results  Component Value Date   CHOL 197 (H) 06/28/2019   TRIG 87 06/28/2019   HDL 29 (L) 06/28/2019   CHOLHDL 6.8 06/28/2019   VLDL 17 06/28/2019   LDLCALC 151 (H) 06/28/2019   Lab Results  Component Value Date   TSH 1.930 06/22/2019    Therapeutic Level Labs: No results found for: LITHIUM No results found for: VALPROATE No components found for:  CBMZ  Current Medications: Current Outpatient Medications  Medication Sig Dispense Refill  . FLUoxetine (PROZAC) 20 MG capsule Take 1 capsule (20 mg total) by mouth daily. 30 capsule 1  . hydrOXYzine (ATARAX/VISTARIL) 25 MG tablet TAKE 1 TABLET (25 MG TOTAL) BY MOUTH AT BEDTIME AS NEEDED FOR ANXIETY (SLEEP). 90 tablet 1   No current facility-administered medications for this visit.     Musculoskeletal: Strength & Muscle Tone:unable to assess since visit was over the telemedicine. Gait & Station:unable to assess since visit was over the telemedicine. Patient leans: N/A  Psychiatric Specialty Exam: ROSReview of 12 systems negative except as mentioned in HPI   Mental Status Exam: Appearance: casually dressed; well groomed; no overt signs of trauma or distress noted Attitude: calm, cooperative with good eye contact Activity: No PMA/PMR, no tics/no tremors; no EPS noted  Speech: normal rate, rhythm and volume Thought Process: Logical, linear, and goal-directed.  Associations: no looseness, tangentiality, circumstantiality, flight of ideas, thought blocking or word salad noted Thought Content: (abnormal/psychotic thoughts): no abnormal or delusional thought process evidenced SI/HI: denies Si/Hi Perception: no illusions or visual/auditory hallucinations  noted; no response to internal stimuli demonstrated Mood & Affect: "good"/full range, neutral Judgment & Insight: both fair Attention and Concentration : Good Cognition : WNL Language : Good ADL - Intact       Assessment and Plan:   # Depression (recurrent, remission) - Restart Prozac 20 mg daily, psychoeducation on med adherence provided as mentioned above.  - Recommended to call current therapist and restart Therapy with Mr Sheets every other week.   - Labs from inpatient previously reviewed, CBC and CMP stable, Cholesterol 197 with HDL 29 and LDL 151; HbA1C 5.3 and TSH - WNL.    # Anxiety (chronic and stable) - His reports are most consistent with generalized and social anxiety disorder with panic attacks.  - Same as mentioned above.   # OCD (improved)     # Asperger's syndrome/ASD (chronic) - Mother's report of some sensory issues, difficulties with transition when he was young, concreteness, concerning for Asperger's syndrome. -  He is currently undergoing eval for ASD.   # Gender dysphoria - at present would like to use she/her pronoun and name - Richard Raymond - parents are aware and appears supportive - Referred to endocrine by PCP  # Abdominal migraine - There has been some concerns for AIP due to hx of abdominal migraine which are described as intermittent episodes of uneasiness in stomach and moderate to severe abdominal pain accompanied with mild numbness in his body, bloating. Her father has similar hx and perhaps worse attacks than him. Spoke with porph[facility]ria [facility] representative previously on their physician consultation line. She is referred to GI by PCP - but they have not followed up. Does not appear interested in this. .       This note was generated in part or whole with voice recognition software. Voice recognition is usually quite accurate but there are transcription errors that can and very often do occur. I apologize for any typographical errors that were  not detected and corrected.  30 minutes total time for encounter today which included chart review, pt evaluation, collaterals, medication and other treatment discussions, medication orders and charting.        [name], MD   08/31/20  2:30 pm

## 2020-10-12 ENCOUNTER — Encounter: Payer: Self-pay | Admitting: Child and Adolescent Psychiatry

## 2020-10-12 ENCOUNTER — Telehealth (INDEPENDENT_AMBULATORY_CARE_PROVIDER_SITE_OTHER): Payer: 59 | Admitting: Child and Adolescent Psychiatry

## 2020-10-12 ENCOUNTER — Other Ambulatory Visit: Payer: Self-pay

## 2020-10-12 DIAGNOSIS — F3341 Major depressive disorder, recurrent, in partial remission: Secondary | ICD-10-CM | POA: Diagnosis not present

## 2020-10-12 DIAGNOSIS — F422 Mixed obsessional thoughts and acts: Secondary | ICD-10-CM

## 2020-10-12 DIAGNOSIS — F418 Other specified anxiety disorders: Secondary | ICD-10-CM | POA: Diagnosis not present

## 2020-10-12 MED ORDER — FLUOXETINE HCL 20 MG PO CAPS: 20.0000 mg | ORAL_CAPSULE | Freq: Every day | ORAL | 1 refills | Status: DC

## 2020-10-12 NOTE — Progress Notes (Signed)
Virtual Visit via Video Note  I connected with Richard Raymond on 10/12/20 at  2:30 PM EST by a video enabled telemedicine application and verified that I am speaking with the correct person using two identifiers.  Location: Patient: home Provider: office   I discussed the limitations of evaluation and management by telemedicine and the availability of in person appointments. The patient expressed understanding and agreed to proceed.  I discussed the assessment and treatment plan with the patient. The patient was provided an opportunity to ask questions and all were answered. The patient agreed with the plan and demonstrated an understanding of the instructions.   The patient was advised to call back or seek an in-person evaluation if the symptoms worsen or if the condition fails to improve as anticipated.  I provided 30 minutes of non-face-to-face time during this encounter.   [name], MD        [facility] MD/PA/NP OP Progress Note 10/12/20 2:30 pm Richard Raymond  MRN:  [id number]  Synopsis: This is a 19 year old Caucasian, assigned male at birth, currently identifying his transgender male preferring for now she/her and name "Richard Raymond".    Chief Complaint: Medication management follow-up.  HPI:   Richard Raymond was evaluated over telemedicine encounter for medication management follow-up.  Richard Raymond reports that she has been feeling tired today because she took a nap during the day yesterday and therefore could not sleep beyond 2:00 in the morning last night.  She reports that her sleep pattern has been erratic and she is working on sleep hygiene.  We discussed that she can try using hydroxyzine as needed for sleeping difficulties but she reports that she cannot afford to sleep over her alarm and therefore does not take hydroxyzine.  She reports that she continues to attend college, has been doing well with the college classes, still looking for a job, spends her free time playing video games, eating  well, reports that mood has been "good", denies any low lows, and rates her anxiety at 4 out of 10(10 = most anxious) which increases upto 6/10 on some days. She denies any suicidal thoughts or homicidal thoughts.  She reports that she does not have obsessive thoughts or compulsive behaviors as she did before. She reports that she has been taking her fluoxetine about 4 to 5 days a week and other days she forgets.  We discussed about improving medication adherence.  She verbalized understanding and agreed to work on this.  Richard Raymond reports that they are still working on getting evaluation done for autism and has a follow-up appointment for endocrinology clinic for hormonal treatment for gender dysphoria.  Visit Diagnosis:    ICD-10-CM   1. Recurrent major depressive disorder, in partial remission (HCC)  F33.41 FLUoxetine (PROZAC) 20 MG capsule  2. Mixed obsessional thoughts and acts  F42.2 FLUoxetine (PROZAC) 20 MG capsule  3. Other specified anxiety disorders  F41.8 FLUoxetine (PROZAC) 20 MG capsule    Past Psychiatric History:reviewed today no change from last visit, psychiatric hospitalization at [facility] for a week at the beginning of August. Discontinued Prozac 60 mg daily by self in 01/2020, back on 20 mg daily from 02/2020 but only partially compliant.  She also started seeing Mr. Bynum Bellows for ind therapy but non compliant.   Past Medical History:  Past Medical History:  Diagnosis Date  . Abdominal migraine   . Medical history non-contributory     Past Surgical History:  Procedure Laterality Date  . WISDOM TOOTH EXTRACTION  Family Psychiatric History: reviewed today, no change    Family History:  Family History  Problem Relation Age of Onset  . ADD / ADHD Brother     Social History:  Social History   Socioeconomic History  . Marital status: Single    Spouse name: Not on file  . Number of children: 0  . Years of education: Not on file  . Highest education level: 12th  grade  Occupational History  . Not on file  Tobacco Use  . Smoking status: Never Smoker  . Smokeless tobacco: Never Used  Vaping Use  . Vaping Use: Never used  Substance and Sexual Activity  . Alcohol use: No  . Drug use: No  . Sexual activity: Not Currently  Other Topics Concern  . Not on file  Social History Narrative  . Not on file   Social Determinants of Health   Financial Resource Strain:   . Difficulty of Paying Living Expenses: Not on file  Food Insecurity:   . Worried About Programme researcher, broadcasting/film/video in the Last Year: Not on file  . Ran Out of Food in the Last Year: Not on file  Transportation Needs:   . Lack of Transportation (Medical): Not on file  . Lack of Transportation (Non-Medical): Not on file  Physical Activity:   . Days of Exercise per Week: Not on file  . Minutes of Exercise per Session: Not on file  Stress:   . Feeling of Stress : Not on file  Social Connections:   . Frequency of Communication with Friends and Family: Not on file  . Frequency of Social Gatherings with Friends and Family: Not on file  . Attends Religious Services: Not on file  . Active Member of Clubs or Organizations: Not on file  . Attends Banker Meetings: Not on file  . Marital Status: Not on file    Allergies:  Allergies  Allergen Reactions  . Cefzil [Cefprozil]     Diarrhea    Metabolic Disorder Labs: Lab Results  Component Value Date   HGBA1C 5.3 06/28/2019   MPG 105.41 06/28/2019   Lab Results  Component Value Date   PROLACTIN 17.5 (H) 06/28/2019   Lab Results  Component Value Date   CHOL 197 (H) 06/28/2019   TRIG 87 06/28/2019   HDL 29 (L) 06/28/2019   CHOLHDL 6.8 06/28/2019   VLDL 17 06/28/2019   LDLCALC 151 (H) 06/28/2019   Lab Results  Component Value Date   TSH 1.930 06/22/2019    Therapeutic Level Labs: No results found for: LITHIUM No results found for: VALPROATE No components found for:  CBMZ  Current Medications: Current  Outpatient Medications  Medication Sig Dispense Refill  . FLUoxetine (PROZAC) 20 MG capsule Take 1 capsule (20 mg total) by mouth daily. 30 capsule 1  . hydrOXYzine (ATARAX/VISTARIL) 25 MG tablet TAKE 1 TABLET (25 MG TOTAL) BY MOUTH AT BEDTIME AS NEEDED FOR ANXIETY (SLEEP). 90 tablet 1   No current facility-administered medications for this visit.     Musculoskeletal: Strength & Muscle Tone:unable to assess since visit was over the telemedicine. Gait & Station:unable to assess since visit was over the telemedicine. Patient leans: N/A  Psychiatric Specialty Exam: ROSReview of 12 systems negative except as mentioned in HPI   Mental Status Exam: Appearance: casually dressed; fairly groomed; no overt signs of trauma or distress noted Attitude: calm, cooperative with good eye contact Activity: No PMA/PMR, no tics/no tremors; no EPS noted  Speech: normal  rate, rhythm and volume Thought Process: Logical, linear, and goal-directed.  Associations: no looseness, tangentiality, circumstantiality, flight of ideas, thought blocking or word salad noted Thought Content: (abnormal/psychotic thoughts): no abnormal or delusional thought process evidenced SI/HI: denies Si/Hi Perception: no illusions or visual/auditory hallucinations noted; no response to internal stimuli demonstrated Mood & Affect: "good"/full range, neutral Judgment & Insight: both fair Attention and Concentration : Good Cognition : WNL Language : Good ADL - Intact        Assessment and Plan:   # Depression (recurrent, remission) - Continue with Prozac 20 mg daily, psychoeducation on med adherence provided as mentioned above.  - He does not want to restart therapy and reports that he is doing well without it.   - Labs from inpatient previously reviewed, CBC and CMP stable, Cholesterol 197 with HDL 29 and LDL 151; HbA1C 5.3 and TSH - WNL.    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  - Same as mentioned above.   # OCD (improved)     # Asperger's syndrome/ASD (chronic) - Mother's report of some sensory issues, difficulties with transition when he was young, concreteness, concerning for Asperger's syndrome. -  He is currently undergoing eval for ASD.   # Gender dysphoria - at present would like to use she/her pronoun and name - Richard Raymond - parents are aware and appears supportive - Referred to endocrine by PCP  # Abdominal migraine - There has been some concerns for AIP due to hx of abdominal migraine which are described as intermittent episodes of uneasiness in stomach and moderate to severe abdominal pain accompanied with mild numbness in his body, bloating. Her father has similar hx and perhaps worse attacks than him. Spoke with [facility] [employer] representative previously on their physician consultation line. She is referred to GI by PCP - but they have not followed up. Does not appear interested in this.  And He appears to be doing well with abdominal migraine.    This note was generated in part or whole with voice recognition software. Voice recognition is usually quite accurate but there are transcription errors that can and very often do occur. I apologize for any typographical errors that were not detected and corrected.  30 minutes total time for encounter today which included chart review, pt evaluation, collaterals, medication and other treatment discussions, medication orders and charting.        [name], MD   10/12/20  2:30 pm

## 2020-12-02 ENCOUNTER — Other Ambulatory Visit: Payer: Managed Care, Other (non HMO)

## 2020-12-02 ENCOUNTER — Other Ambulatory Visit: Payer: Self-pay

## 2020-12-02 DIAGNOSIS — Z20822 Contact with and (suspected) exposure to covid-19: Secondary | ICD-10-CM

## 2020-12-07 LAB — NOVEL CORONAVIRUS, NAA: SARS-CoV-2, NAA: NOT DETECTED

## 2020-12-12 ENCOUNTER — Telehealth (INDEPENDENT_AMBULATORY_CARE_PROVIDER_SITE_OTHER): Payer: 59 | Admitting: Child and Adolescent Psychiatry

## 2020-12-12 ENCOUNTER — Other Ambulatory Visit: Payer: Self-pay

## 2020-12-12 DIAGNOSIS — F418 Other specified anxiety disorders: Secondary | ICD-10-CM

## 2020-12-12 DIAGNOSIS — F422 Mixed obsessional thoughts and acts: Secondary | ICD-10-CM

## 2020-12-12 DIAGNOSIS — F3341 Major depressive disorder, recurrent, in partial remission: Secondary | ICD-10-CM

## 2020-12-12 MED ORDER — FLUOXETINE HCL 20 MG PO CAPS: 20.0000 mg | ORAL_CAPSULE | Freq: Every day | ORAL | 1 refills | Status: DC

## 2020-12-12 NOTE — Progress Notes (Signed)
Virtual Visit via Video Note  I connected with Richard Raymond on 12/12/20 at  2:30 PM EST by a video enabled telemedicine application and verified that I am speaking with the correct person using two identifiers.  Location: Patient: home Provider: office   I discussed the limitations of evaluation and management by telemedicine and the availability of in person appointments. The patient expressed understanding and agreed to proceed.  I discussed the assessment and treatment plan with the patient. The patient was provided an opportunity to ask questions and all were answered. The patient agreed with the plan and demonstrated an understanding of the instructions.   The patient was advised to call back or seek an in-person evaluation if the symptoms worsen or if the condition fails to improve as anticipated.  I provided 30 minutes of non-face-to-face time during this encounter.   [name], MD        [facility] MD/PA/NP OP Progress Note 12/12/20 2:30 pm Rushawn Wecker  MRN:  [id number]  Synopsis: This is a 20 year old Caucasian, assigned male at birth, currently identifying his transgender male preferring for now she/her and name "Richard Raymond".    Chief Complaint: Medication management follow-up. HPI:   Richard Raymond was seen and evaluated over telemedicine encounter for medication management follow-up.  Richard Raymond reports that although she is COVID-negative but she has all the symptoms that are consistent with COVID 19.  She reports that she has been having symptoms since last weekend they are not improving.  She reports that she earlier today she called her PCP to see him however they asked her to get an appointment.  She reports that she has not called PCP yet to make an appointment and she was recommended to call right away after completing this appointment with this writer.  She was also recommended to go to the emergency room if she notices worsening of shortness of breath or her symptoms.  Writer also  asked if she would like to reschedule this appointment to a later date however she wanted to have this appointment today.  She reports that since she got sick she has not been taking her medications which is fluoxetine 20 mg once a day.  She reports that because of being sick her schedule is disrupted and therefore she is forgetting to take her medications.  We discussed to restart Prozac at 20 mg once a day.  She verbalized understanding and agreed with the plan.  She reports that she does not remember how her mood was prior to she got sick but was disappointed that she had to reschedule her endocrinology appointment because of COVID-19.  She reports that she is glad that her appointment is early next month.  With regards of anxiety she reports that her anxiety is at about 4 out of 10(10 = most anxious).  She denies any concerns for today's appointment.  She was eating and sleeping well prior to getting sick.  She also reports that she did well during the last semester at college and made all A's and B's except 1 class.  We discussed to continue with her current medications and follow-up in 4 to 6 weeks or earlier if needed.  She denies any SI/HI.   Visit Diagnosis:    ICD-10-CM   1. Recurrent major depressive disorder, in partial remission (HCC)  F33.41 FLUoxetine (PROZAC) 20 MG capsule  2. Mixed obsessional thoughts and acts  F42.2 FLUoxetine (PROZAC) 20 MG capsule  3. Other specified anxiety disorders  F41.8 FLUoxetine (PROZAC) 20  MG capsule    Past Psychiatric History:reviewed today no change from last visit, psychiatric hospitalization at [facility] for a week at the beginning of August. Discontinued Prozac 60 mg daily by self in 01/2020, back on 20 mg daily from 02/2020 but only partially compliant.  She also started seeing Mr. Bynum Bellows for ind therapy but non compliant.   Past Medical History:  Past Medical History:  Diagnosis Date  . Abdominal migraine   . Medical history non-contributory      Past Surgical History:  Procedure Laterality Date  . WISDOM TOOTH EXTRACTION      Family Psychiatric History: reviewed today, no change    Family History:  Family History  Problem Relation Age of Onset  . ADD / ADHD Brother     Social History:  Social History   Socioeconomic History  . Marital status: Single    Spouse name: Not on file  . Number of children: 0  . Years of education: Not on file  . Highest education level: 12th grade  Occupational History  . Not on file  Tobacco Use  . Smoking status: Never Smoker  . Smokeless tobacco: Never Used  Vaping Use  . Vaping Use: Never used  Substance and Sexual Activity  . Alcohol use: No  . Drug use: No  . Sexual activity: Not Currently  Other Topics Concern  . Not on file  Social History Narrative  . Not on file   Social Determinants of Health   Financial Resource Strain: Not on file  Food Insecurity: Not on file  Transportation Needs: Not on file  Physical Activity: Not on file  Stress: Not on file  Social Connections: Not on file    Allergies:  Allergies  Allergen Reactions  . Cefzil [Cefprozil]     Diarrhea    Metabolic Disorder Labs: Lab Results  Component Value Date   HGBA1C 5.3 06/28/2019   MPG 105.41 06/28/2019   Lab Results  Component Value Date   PROLACTIN 17.5 (H) 06/28/2019   Lab Results  Component Value Date   CHOL 197 (H) 06/28/2019   TRIG 87 06/28/2019   HDL 29 (L) 06/28/2019   CHOLHDL 6.8 06/28/2019   VLDL 17 06/28/2019   LDLCALC 151 (H) 06/28/2019   Lab Results  Component Value Date   TSH 1.930 06/22/2019    Therapeutic Level Labs: No results found for: LITHIUM No results found for: VALPROATE No components found for:  CBMZ  Current Medications: Current Outpatient Medications  Medication Sig Dispense Refill  . FLUoxetine (PROZAC) 20 MG capsule Take 1 capsule (20 mg total) by mouth daily. 30 capsule 1  . hydrOXYzine (ATARAX/VISTARIL) 25 MG tablet TAKE 1 TABLET (25  MG TOTAL) BY MOUTH AT BEDTIME AS NEEDED FOR ANXIETY (SLEEP). 90 tablet 1   No current facility-administered medications for this visit.     Musculoskeletal: Strength & Muscle Tone:unable to assess since visit was over the telemedicine. Gait & Station:unable to assess since visit was over the telemedicine. Patient leans: N/A  Psychiatric Specialty Exam: ROSReview of 12 systems negative except as mentioned in HPI   Mental Status Exam: Appearance: casually dressed; dishevelled Attitude: calm, cooperative with fair eye contact Activity: No PMA/PMR, no tics/no tremors; no EPS noted  Speech: monotonous, with decreased rate Thought Process: Logical, linear, and goal-directed.  Associations: no looseness, tangentiality, circumstantiality, flight of ideas, thought blocking or word salad noted Thought Content: (abnormal/psychotic thoughts): no abnormal or delusional thought process evidenced SI/HI: denies Si/Hi Perception:  no illusions or visual/auditory hallucinations noted; no response to internal stimuli demonstrated Mood & Affect: "ok"/blunted Judgment & Insight: both fair Attention and Concentration : Good Cognition : WNL Language : Good ADL - Intact       Assessment and Plan:   # Depression (recurrent, remission) - Restart Prozac 20 mg daily, psychoeducation on med adherence provided as mentioned above.  He recently stopped taking it after getting sick with COVID -19 last week - He does not want to restart therapy and reports that he is doing well without it.   - Labs from inpatient previously reviewed, CBC and CMP stable, Cholesterol 197 with HDL 29 and LDL 151; HbA1C 5.3 and TSH - WNL.    # Anxiety (chronic and stable) - His reports are most consistent with generalized and social anxiety disorder with panic attacks.  - Same as mentioned above.   # OCD (improved)     # Asperger's syndrome/ASD (chronic) - Mother's report of some sensory issues, difficulties with  transition when he was young, concreteness, concerning for Asperger's syndrome. -  He is currently undergoing eval for ASD.   # Gender dysphoria - at present would like to use she/her pronoun and name - Richard Raymond - parents are aware and appears supportive - Referred to endocrine by PCP, has an appointment in 02/22   This note was generated in part or whole with voice recognition software. Voice recognition is usually quite accurate but there are transcription errors that can and very often do occur. I apologize for any typographical errors that were not detected and corrected.  30 minutes total time for encounter today which included chart review, pt evaluation, collaterals, medication and other treatment discussions, medication orders and charting.        [name], MD   12/12/20  2:30 pm

## 2020-12-13 ENCOUNTER — Telehealth (INDEPENDENT_AMBULATORY_CARE_PROVIDER_SITE_OTHER): Payer: Managed Care, Other (non HMO) | Admitting: Family Medicine

## 2020-12-13 ENCOUNTER — Encounter: Payer: Self-pay | Admitting: Family Medicine

## 2020-12-13 ENCOUNTER — Telehealth: Payer: Self-pay | Admitting: Family Medicine

## 2020-12-13 DIAGNOSIS — Z20822 Contact with and (suspected) exposure to covid-19: Secondary | ICD-10-CM

## 2020-12-13 DIAGNOSIS — R059 Cough, unspecified: Secondary | ICD-10-CM

## 2020-12-13 MED ORDER — ALBUTEROL SULFATE HFA 108 (90 BASE) MCG/ACT IN AERS: 2.0000 | INHALATION_SPRAY | Freq: Four times a day (QID) | RESPIRATORY_TRACT | 0 refills | Status: AC | PRN

## 2020-12-13 MED ORDER — BENZONATATE 100 MG PO CAPS: 100.0000 mg | ORAL_CAPSULE | Freq: Two times a day (BID) | ORAL | 0 refills | Status: DC | PRN

## 2020-12-13 NOTE — Progress Notes (Signed)
Pt having cough, congestion, drainage, sore throat, trouble breathing, headache and muscle aches. Pt was tested for COVID on 12/02/20 but was negative. Mom and brother were positive and he does live with them. Symptoms began last week. Pt has had COVID vaccine but unsure of name.   Virtual Visit via Telephone Note  I connected with Richard Raymond on 12/13/20 at 10:40 AM EST by telephone and verified that I am speaking with the correct person using two identifiers.  Location: Patient: home Provider: office   I discussed the limitations, risks, security and privacy concerns of performing an evaluation and management service by telephone and the availability of in person appointments. I also discussed with the patient that there may be a patient responsible charge related to this service. The patient expressed understanding and agreed to proceed.   History of Present Illness:    Observations/Objective:   Assessment and Plan:   Follow Up Instructions:    I discussed the assessment and treatment plan with the patient. The patient was provided an opportunity to ask questions and all were answered. The patient agreed with the plan and demonstrated an understanding of the instructions.   The patient was advised to call back or seek an in-person evaluation if the symptoms worsen or if the condition fails to improve as anticipated.  I provided 11 minutes of non-face-to-face time during this encounter.    Patient ID: Richard Raymond, adult    DOB: 2001-08-29, 19 y.o.   MRN: [id number]   Chief Complaint  Patient presents with  . Cough   Subjective:  CC: cough, congestion, around Covid positive family  This is a new problem.  Presents today as a phone visit with a complaint cough, congestion, sinus drainage, sore throat, trouble breathing, headache, and muscle aches.  Symptoms started last week Wednesday Thursday or Friday when asked to describe trouble breathing describes as cannot breathe deeply  and it is painful to do so.  Reports pain is in the center of the chest.  Reports that has not been drinking fluids so to keep himself "dry ".  Reports that took a COVID on January 8, and it was negative at that time.  Reports living with COVID-positive people in the same home.  Reports fever, unable to tell me how high, has not checked.    Medical History Richard Raymond has a past medical history of Abdominal migraine and Medical history non-contributory.   Outpatient Encounter Medications as of 12/13/2020  Medication Sig  . albuterol (VENTOLIN HFA) 108 (90 Base) MCG/ACT inhaler Inhale 2 puffs into the lungs every 6 (six) hours as needed for wheezing or shortness of breath.  . benzonatate (TESSALON) 100 MG capsule Take 1 capsule (100 mg total) by mouth 2 (two) times daily as needed for cough.  Marland Kitchen FLUoxetine (PROZAC) 20 MG capsule Take 1 capsule (20 mg total) by mouth daily.  . hydrOXYzine (ATARAX/VISTARIL) 25 MG tablet TAKE 1 TABLET (25 MG TOTAL) BY MOUTH AT BEDTIME AS NEEDED FOR ANXIETY (SLEEP).   No facility-administered encounter medications on file as of 12/13/2020.     Review of Systems  Constitutional: Positive for fatigue and fever. Negative for chills.       Hasn't check  HENT: Positive for congestion, ear pain, postnasal drip and sore throat.   Respiratory: Positive for cough.   Gastrointestinal: Positive for abdominal pain, diarrhea and nausea. Negative for vomiting.  Musculoskeletal: Positive for myalgias.  Neurological: Positive for headaches.     Vitals There were no vitals  taken for this visit. Unable, reports that he does not have a pulse oximeter to check oxygen saturation. Objective:   Physical Exam  Unable, is able to converse throughout telephone interview without obvious shortness of breath. Assessment and Plan   1. Close exposure to COVID-19 virus - benzonatate (TESSALON) 100 MG capsule; Take 1 capsule (100 mg total) by mouth 2 (two) times daily as needed for cough.   Dispense: 20 capsule; Refill: 0 - albuterol (VENTOLIN HFA) 108 (90 Base) MCG/ACT inhaler; Inhale 2 puffs into the lungs every 6 (six) hours as needed for wheezing or shortness of breath.  Dispense: 8 g; Refill: 0  2. Cough in adult - benzonatate (TESSALON) 100 MG capsule; Take 1 capsule (100 mg total) by mouth 2 (two) times daily as needed for cough.  Dispense: 20 capsule; Refill: 0 - albuterol (VENTOLIN HFA) 108 (90 Base) MCG/ACT inhaler; Inhale 2 puffs into the lungs every 6 (six) hours as needed for wheezing or shortness of breath.  Dispense: 8 g; Refill: 0   Due to close exposure to COVID-19 and current symptoms, recommend COVID testing again.  [place] testing information  given.  Recommend supportive therapy, adequate hydration, instructed that becoming dehydrated to avoid nasal symptoms is risky, and could harm his kidneys.  Encouraged him to take adequate fluids.  Will treat cough and his inability to take a deep breath with Tessalon Perles and albuterol inhaler.  Agrees with plan of care discussed today. Understands warning signs to seek further care: chest pain, shortness of breath, any significant change in health.  Understands to follow-up if symptoms do not improve, or worsen.  COVID respiratory warning given as stated below.  Covid-19 warning:  Covid-19 is a virus that causes hypoxia (low oxygen level in blood) in some people. If you develop any changes in your usual breathing pattern: difficulty catching your breath, more short winded with activity or with resting, or anything that concerns you about your breathing, do not hesitate to go to the emergency department immediately for evaluation. Covid infection can also affect the way the brain functions if it lacks oxygen, such as, feeling dizzy, passing out, or feeling confused, if you experience any of these symptoms, please do not delay to seek treatment.  Some people experience gastrointestinal problems with Covid, such as  vomiting and diarrhea, dehydration is a serious risk and should be avoided. If you are unable to keep liquids down you may need to go to the emergency department for intravenous fluids to avoid dehydration.   Please alert and involve your family and/or friends to help keep an eye on you while you recover from Covid-19. If you have any questions or concerns about your recovery, please do not hesitate to call the office for guidance.   Recommend supportive therapy while you are recovering:   1) Get lots of rest.  2) Take over the counter pain medication if needed, such as acetaminophen or ibuprofen. Read and follow instructions on the label and make sure not to combine other medications that may have same ingredients in it. It is important to not take too much of these ingredients.  3) Drink plenty of caffeine-free fluids. (If you have heart or kidney problems, follow the instructions of your specialist regarding amounts).  4) If you are hungry, eat a bland diet, such as the BRAT diet (bananas, rice, applesauce, toast).  5) Let us know if you are not feeling better in a week.  Covid-19 Quarantine Instructions:   You  have tested positive for Covid-19 infection. The current CDC guidelines for quarantine regardless of vaccination status are:   Please quarantine and isolate at home for a minimum of  5 days.   - If you have no symptoms or your symptoms are resolving after 5 days you   can leave the home (resolving means no shortness of breath, no fever, without taking fever reducing medication, no headache, etc). -Continue to wear a mask around others for an additional 5 days.  -If you were severely ill with Covid-19 you should isolate for at least 10 days.    Use over-the-counter medications for symptoms.If you develop respiratory issues/distress (see Covid warning), seek medical care in the Emergency Department.  If you must leave home or if you have to be around others please wear a mask.  Please limit contact with immediate family members in the home, practice social distancing, frequent handwashing and clean hard surfaces touched frequently with household cleaning products. Members of your household will also need to quarantine for 5 days and test on day five if possible.  Covid-19 warning:  Covid-19 is a virus that causes hypoxia (low oxygen level in blood) in some people. If you develop any changes in your usual breathing pattern: difficulty catching your breath, more short winded with activity or with resting, or anything that concerns you about your breathing, do not hesitate to go to the emergency department immediately for evaluation. Covid infection can also affect the way the brain functions if it lacks oxygen, such as, feeling dizzy, passing out, or feeling confused, if you experience any of these symptoms, please do not delay to seek treatment.  Some people experience gastrointestinal problems with Covid, such as vomiting and diarrhea, dehydration is a serious risk and should be avoided. If you are unable to keep liquids down you may need to go to the emergency department for intravenous fluids to avoid dehydration.   Please alert and involve your family and/or friends to help keep an eye on you while you recover from Covid-19. If you have any questions or concerns about your recovery, please do not hesitate to call the office for guidance.     Dorena Bodo, FNP-C 12/13/2020

## 2020-12-13 NOTE — Telephone Encounter (Signed)
Ms. Richard Raymond, Richard Raymond are scheduled for a virtual visit with your provider today.    Just as we do with appointments in the office, we must obtain your consent to participate.  Your consent will be active for this visit and any virtual visit you may have with one of our providers in the next 365 days.    If you have a MyChart account, I can also send a copy of this consent to you electronically.  All virtual visits are billed to your insurance company just like a traditional visit in the office.  As this is a virtual visit, video technology does not allow for your provider to perform a traditional examination.  This may limit your provider's ability to fully assess your condition.  If your provider identifies any concerns that need to be evaluated in person or the need to arrange testing such as labs, EKG, etc, we will make arrangements to do so.    Although advances in technology are sophisticated, we cannot ensure that it will always work on either your end or our end.  If the connection with a video visit is poor, we may have to switch to a telephone visit.  With either a video or telephone visit, we are not always able to ensure that we have a secure connection.   I need to obtain your verbal consent now.   Are you willing to proceed with your visit today?   Sheriff Min has provided verbal consent on 12/13/2020 for a virtual visit (video or telephone).   Marlowe Shores, LPN 2/33/0076  22:63 AM

## 2020-12-21 ENCOUNTER — Other Ambulatory Visit: Payer: Managed Care, Other (non HMO)

## 2020-12-26 ENCOUNTER — Encounter: Payer: Self-pay | Admitting: Family Medicine

## 2020-12-26 ENCOUNTER — Telehealth (INDEPENDENT_AMBULATORY_CARE_PROVIDER_SITE_OTHER): Payer: Managed Care, Other (non HMO) | Admitting: Family Medicine

## 2020-12-26 ENCOUNTER — Other Ambulatory Visit: Payer: Self-pay

## 2020-12-26 ENCOUNTER — Telehealth: Payer: Self-pay | Admitting: Family Medicine

## 2020-12-26 DIAGNOSIS — J019 Acute sinusitis, unspecified: Secondary | ICD-10-CM

## 2020-12-26 MED ORDER — AMOXICILLIN 500 MG PO TABS: 500.0000 mg | ORAL_TABLET | Freq: Three times a day (TID) | ORAL | 0 refills | Status: DC

## 2020-12-26 NOTE — Telephone Encounter (Signed)
Ms. taino, maertens are scheduled for a virtual visit with your provider today.    Just as we do with appointments in the office, we must obtain your consent to participate.  Your consent will be active for this visit and any virtual visit you may have with one of our providers in the next 365 days.    If you have a MyChart account, I can also send a copy of this consent to you electronically.  All virtual visits are billed to your insurance company just like a traditional visit in the office.  As this is a virtual visit, video technology does not allow for your provider to perform a traditional examination.  This may limit your provider's ability to fully assess your condition.  If your provider identifies any concerns that need to be evaluated in person or the need to arrange testing such as labs, EKG, etc, we will make arrangements to do so.    Although advances in technology are sophisticated, we cannot ensure that it will always work on either your end or our end.  If the connection with a video visit is poor, we may have to switch to a telephone visit.  With either a video or telephone visit, we are not always able to ensure that we have a secure connection.   I need to obtain your verbal consent now.   Are you willing to proceed with your visit today?   Rivaldo Shearon has provided verbal consent on 12/26/2020 for a virtual visit (video or telephone).   Marlowe Shores, LPN 03/28/6143  3:15 PM

## 2020-12-26 NOTE — Progress Notes (Signed)
   Subjective:    Patient ID: Richard Raymond, adult    DOB: 2001-11-16, 19 y.o.   MRN: [id number]  HPI Pt states he has been having extreme dizziness for about 3 weeks. States he feels like he is in a [employer] with 4 toddlers bouncing around him. Also having headaches and clogged sinuses. Pt tested negative for COVID 3 weeks ago.  Denies wheezing difficulty breathing high fevers vomiting diarrhea PMH benign Virtual Visit via Telephone Note  I connected with Richard Raymond on 12/26/20 at  3:30 PM EST by telephone and verified that I am speaking with the correct person using two identifiers.  Location: Patient: home Provider: office   I discussed the limitations, risks, security and privacy concerns of performing an evaluation and management service by telephone and the availability of in person appointments. I also discussed with the patient that there may be a patient responsible charge related to this service. The patient expressed understanding and agreed to proceed.   History of Present Illness:    Observations/Objective:   Assessment and Plan:   Follow Up Instructions:    I discussed the assessment and treatment plan with the patient. The patient was provided an opportunity to ask questions and all were answered. The patient agreed with the plan and demonstrated an understanding of the instructions.   The patient was advised to call back or seek an in-person evaluation if the symptoms worsen or if the condition fails to improve as anticipated.  I provided 20 minutes of non-face-to-face time during this encounter.       Review of Systems  Constitutional: Negative for activity change and fever.  HENT: Positive for congestion and rhinorrhea. Negative for ear pain.   Eyes: Negative for discharge.  Respiratory: Positive for cough. Negative for shortness of breath and wheezing.   Cardiovascular: Negative for chest pain.       Objective:   Physical Exam   Today's  visit was via telephone Physical exam was not possible for this visit      Assessment & Plan:  Acute rhinosinusitis with repetitive infections over the past 3 weeks possible COVID several weeks ago no need to test currently recommend 1 round of antibiotics if ongoing troubles problems or worsening issues follow-up sooner

## 2021-01-09 ENCOUNTER — Other Ambulatory Visit: Payer: Self-pay | Admitting: Child and Adolescent Psychiatry

## 2021-01-09 DIAGNOSIS — F418 Other specified anxiety disorders: Secondary | ICD-10-CM

## 2021-01-09 DIAGNOSIS — F3341 Major depressive disorder, recurrent, in partial remission: Secondary | ICD-10-CM

## 2021-01-09 DIAGNOSIS — F422 Mixed obsessional thoughts and acts: Secondary | ICD-10-CM

## 2021-01-12 ENCOUNTER — Ambulatory Visit (INDEPENDENT_AMBULATORY_CARE_PROVIDER_SITE_OTHER): Payer: Managed Care, Other (non HMO) | Admitting: Family Medicine

## 2021-01-12 ENCOUNTER — Other Ambulatory Visit: Payer: Self-pay

## 2021-01-12 VITALS — BP 120/79 | Ht 64.0 in | Wt 209.0 lb

## 2021-01-12 DIAGNOSIS — R2681 Unsteadiness on feet: Secondary | ICD-10-CM | POA: Diagnosis not present

## 2021-01-12 DIAGNOSIS — R42 Dizziness and giddiness: Secondary | ICD-10-CM | POA: Diagnosis not present

## 2021-01-12 DIAGNOSIS — R519 Headache, unspecified: Secondary | ICD-10-CM | POA: Diagnosis not present

## 2021-01-12 DIAGNOSIS — R270 Ataxia, unspecified: Secondary | ICD-10-CM

## 2021-01-12 DIAGNOSIS — H814 Vertigo of central origin: Secondary | ICD-10-CM

## 2021-01-12 NOTE — Progress Notes (Signed)
   Subjective:    Patient ID: Richard Raymond, adult    DOB: 2001-03-17, 20 y.o.   MRN: [id number]  HPI  Patient arrives with dizziness for 3 weeks -like room spinning or on bouncy castle and cant get off Over the past few weeks he has felt off balance relates he has had headaches for months he describes as throbbing sometimes severe sometimes with nausea occasionally with vomiting does not wake him up at night has tried various measures including Tylenol rest and nothing is seem to help over the past 3 weeks he has had dizziness where he has felt off balance and woozy with intermittent dizziness.  He is concerned that he has an underlying health issue going on.  It has gotten to the point where he can hardly walk around without having to hold onto something or have someone hold onto him.  He states he has had several falls but no serious injuries.  Has never had this before.  No known injury. Review of Systems  Constitutional: Negative for activity change, appetite change and fatigue.  HENT: Negative for congestion.   Respiratory: Negative for cough.   Cardiovascular: Negative for chest pain.  Gastrointestinal: Negative for abdominal pain.  Skin: Negative for color change.  Neurological: Positive for dizziness, weakness and headaches.  Psychiatric/Behavioral: Negative for behavioral problems.       Objective:   Physical Exam With patient moves eyes back-and-forth with nystagmus testing not bad but when he angles up to the left or down to the left he has a wavering nystagmus Lungs are clear respiratory rate normal heart regular Strength is fair finger-to-nose difficult for the patient to do back and forth between his nose and a moving point He also has difficult time with Romberg, it is positive Also when he walks wide-based unsteady gait having to hold onto someone else to walk      Assessment & Plan:  1. Ataxia Severe ataxia.  Could not walk without holding onto other people.  Patient  also has some wavering nystagmus.  Previously prolactin was slightly elevated.  We will recheck prolactin.  We will also order MRI of the brain.  And progress forward depending on what finds out. - MR Brain Wo Contrast - Basic metabolic panel - Prolactin  2. Dizziness The constant dizziness is more likely neurological.  I doubt it is due to medications.  Patient has been under a lot of stress which can play a role.  Antivert when necessary at bedtime otherwise no medications until we have test results back.  May need physical therapy for gait training may need neurologic evaluation with a neurologist depending on what the results show and how the patient is doing recheck in 4 weeks - MR Brain Wo Contrast - Basic metabolic panel - Prolactin  3. Unsteady gait See above - MR Brain Wo Contrast - Basic metabolic panel - Prolactin  4. Severe headache MRI of the brain has severe headaches that last every single day worse at nighttime.  Occasionally nauseous occasionally vomiting relates ever since this started he is having difficult time walking - MR Brain Wo Contrast - Basic metabolic panel - Prolactin  5. Vertigo of central origin Please see above - MR Brain Wo Contrast

## 2021-01-19 ENCOUNTER — Telehealth: Payer: Self-pay | Admitting: Family Medicine

## 2021-01-24 LAB — BASIC METABOLIC PANEL
BUN/Creatinine Ratio: 13 (ref 9–20)
BUN: 12 mg/dL (ref 6–20)
CO2: 23 mmol/L (ref 20–29)
Calcium: 9.7 mg/dL (ref 8.7–10.2)
Chloride: 103 mmol/L (ref 96–106)
Creatinine, Ser: 0.91 mg/dL (ref 0.76–1.27)
Glucose: 82 mg/dL (ref 65–99)
Potassium: 4.6 mmol/L (ref 3.5–5.2)
Sodium: 143 mmol/L (ref 134–144)
eGFR: 124 mL/min/{1.73_m2} (ref >59)

## 2021-01-24 LAB — PROLACTIN: Prolactin: 9.2 ng/mL (ref 4.0–15.2)

## 2021-01-26 ENCOUNTER — Other Ambulatory Visit: Payer: Self-pay

## 2021-01-26 ENCOUNTER — Telehealth (INDEPENDENT_AMBULATORY_CARE_PROVIDER_SITE_OTHER): Payer: 59 | Admitting: Child and Adolescent Psychiatry

## 2021-01-26 ENCOUNTER — Telehealth: Payer: Self-pay | Admitting: Child and Adolescent Psychiatry

## 2021-01-26 DIAGNOSIS — F422 Mixed obsessional thoughts and acts: Secondary | ICD-10-CM

## 2021-01-26 DIAGNOSIS — F418 Other specified anxiety disorders: Secondary | ICD-10-CM

## 2021-01-26 DIAGNOSIS — F3341 Major depressive disorder, recurrent, in partial remission: Secondary | ICD-10-CM | POA: Diagnosis not present

## 2021-01-26 MED ORDER — FLUOXETINE HCL 10 MG PO CAPS: 30.0000 mg | ORAL_CAPSULE | Freq: Every day | ORAL | 0 refills | Status: DC

## 2021-01-26 MED ORDER — HYDROXYZINE HCL 25 MG PO TABS: 25.0000 mg | ORAL_TABLET | Freq: Every evening | ORAL | 0 refills | Status: AC | PRN

## 2021-01-26 NOTE — Progress Notes (Signed)
Virtual Visit via Video Note  I connected with Richard Raymond on 01/26/21 at 10:30 AM EST by a video enabled telemedicine application and verified that I am speaking with the correct person using two identifiers.  Location: Patient: home Provider: office   I discussed the limitations of evaluation and management by telemedicine and the availability of in person appointments. The patient expressed understanding and agreed to proceed.  I discussed the assessment and treatment plan with the patient. The patient was provided an opportunity to ask questions and all were answered. The patient agreed with the plan and demonstrated an understanding of the instructions.   The patient was advised to call back or seek an in-person evaluation if the symptoms worsen or if the condition fails to improve as anticipated.  I provided 30 minutes of non-face-to-face time during this encounter.   Richard Smalling, MD        [facility] MD/PA/NP OP Progress Note 01/26/21 10:30 am Richard Raymond  MRN:  [id number]  Synopsis: This is a 20 year old Caucasian, assigned male at birth, currently identifying his transgender male preferring for now she/her and name "Richard Raymond".    Chief Complaint: Medication management follow up  HPI:   Richard Raymond was seen and evaluated over telemedicine encounter for medication management follow-up.  In the interim since last appointment she apparently had appointments with her PCP due to constant dizziness, weakness and her PCP has referred her for MRI of the brain.  Richard Raymond reports that she has been feeling sick since last one month, has constant dizziness, unable to walk without support, unable to concentrate because of dizziness, and also reports weakness.  She reports that she has followed up with PCP and have done blood work and she is scheduled for MRI. She also reports that she continues to have abdominal migraine about 1-2 every 2 weeks accompanies with nasuea, diarrhea, and some numbness.  This lasts for about half and hour to few hours and rates the pain at 7/10 (10 = most pain)  She reports that sickness has negatively impacted her mental health and reported depressed mood, anxiety, anhedonia, problems with sleep, lack of energy, difficulties with concentration and scored 16 on PHQ 9.  She reports that she is still trying to do whatever she can regarding her schoolwork but has not been able to attend school.  She reports that she has been compliant to Prozac 20 mg once a day, does miss about twice a week since last 2 weeks.  We discussed to increase the dose of Prozac to 30 mg once a day to help her with symptoms of depression and anxiety.  She has taken higher dose of Prozac in the past without any issues of dizziness.  Visit Diagnosis:    ICD-10-CM   1. Recurrent major depressive disorder, in partial remission (HCC)  F33.41 FLUoxetine (PROZAC) 10 MG capsule  2. Mixed obsessional thoughts and acts  F42.2 FLUoxetine (PROZAC) 10 MG capsule    hydrOXYzine (ATARAX/VISTARIL) 25 MG tablet  3. Other specified anxiety disorders  F41.8 FLUoxetine (PROZAC) 10 MG capsule    hydrOXYzine (ATARAX/VISTARIL) 25 MG tablet    Past Psychiatric History:reviewed today no change from last visit, psychiatric hospitalization at [facility] for a week at the beginning of August. Discontinued Prozac 60 mg daily by self in 01/2020, back on 20 mg daily from 02/2020 but only partially compliant.  She also started seeing Mr. Bynum Bellows for ind therapy but non compliant.   Past Medical History:  Past Medical  History:  Diagnosis Date  . Abdominal migraine   . Medical history non-contributory     Past Surgical History:  Procedure Laterality Date  . WISDOM TOOTH EXTRACTION      Family Psychiatric History: reviewed today, no change    Family History:  Family History  Problem Relation Age of Onset  . ADD / ADHD Brother     Social History:  Social History   Socioeconomic History  . Marital status:  Single    Spouse name: Not on file  . Number of children: 0  . Years of education: Not on file  . Highest education level: 12th grade  Occupational History  . Not on file  Tobacco Use  . Smoking status: Never Smoker  . Smokeless tobacco: Never Used  Vaping Use  . Vaping Use: Never used  Substance and Sexual Activity  . Alcohol use: No  . Drug use: No  . Sexual activity: Not Currently  Other Topics Concern  . Not on file  Social History Narrative  . Not on file   Social Determinants of Health   Financial Resource Strain: Not on file  Food Insecurity: Not on file  Transportation Needs: Not on file  Physical Activity: Not on file  Stress: Not on file  Social Connections: Not on file    Allergies:  Allergies  Allergen Reactions  . Cefzil [Cefprozil]     Diarrhea    Metabolic Disorder Labs: Lab Results  Component Value Date   HGBA1C 5.3 06/28/2019   MPG 105.41 06/28/2019   Lab Results  Component Value Date   PROLACTIN 9.2 01/23/2021   PROLACTIN 17.5 (H) 06/28/2019   Lab Results  Component Value Date   CHOL 197 (H) 06/28/2019   TRIG 87 06/28/2019   HDL 29 (L) 06/28/2019   CHOLHDL 6.8 06/28/2019   VLDL 17 06/28/2019   LDLCALC 151 (H) 06/28/2019   Lab Results  Component Value Date   TSH 1.930 06/22/2019    Therapeutic Level Labs: No results found for: LITHIUM No results found for: VALPROATE No components found for:  CBMZ  Current Medications: Current Outpatient Medications  Medication Sig Dispense Refill  . albuterol (VENTOLIN HFA) 108 (90 Base) MCG/ACT inhaler Inhale 2 puffs into the lungs every 6 (six) hours as needed for wheezing or shortness of breath. 8 g 0  . benzonatate (TESSALON) 100 MG capsule Take 1 capsule (100 mg total) by mouth 2 (two) times daily as needed for cough. (Patient not taking: Reported on 01/12/2021) 20 capsule 0  . FLUoxetine (PROZAC) 10 MG capsule Take 3 capsules (30 mg total) by mouth daily. 90 capsule 0  . hydrOXYzine  (ATARAX/VISTARIL) 25 MG tablet Take 1 tablet (25 mg total) by mouth at bedtime as needed for anxiety (sleep). 90 tablet 0   No current facility-administered medications for this visit.     Musculoskeletal: Strength & Muscle Tone:unable to assess since visit was over the telemedicine. Gait & Station:unable to assess since visit was over the telemedicine. Patient leans: N/A  Psychiatric Specialty Exam: ROSReview of 12 systems negative except as mentioned in HPI   Mental Status Exam: Appearance: casually dressed; well groomed; no overt signs of trauma or distress noted Attitude: calm, cooperative with good eye contact Activity: No PMA/PMR, no tics/no tremors; no EPS noted  Speech: monotonous Thought Process: Logical, linear, and goal-directed.  Associations: no looseness, tangentiality, circumstantiality, flight of ideas, thought blocking or word salad noted Thought Content: (abnormal/psychotic thoughts): no abnormal or delusional thought process  evidenced SI/HI: denies Si/Hi Perception: no illusions or visual/auditory hallucinations noted; no response to internal stimuli demonstrated Mood & Affect: "ok"/restricted Judgment & Insight: both fair Attention and Concentration : Good Cognition : WNL Language : Good ADL - Intact       Assessment and Plan:   # Depression (recurrent, moderate) - Increase Prozac to 30 mg daily, psychoeducation on med adherence provided as mentioned above.   - He did not want to restart therapy but agrees to reconsider after he is physically well.     # Anxiety (chronic and worse) - anxiety appears to have worsened in the context of recent physical health issues. Meds as mentioned.  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  - Same as mentioned above.   # OCD (improved)  # Asperger's syndrome/ASD (chronic) - Mother's report of some sensory issues,  difficulties with transition when he was young, concreteness, concerning for Asperger's syndrome. -  He is currently undergoing eval for ASD.   # Gender dysphoria - at present would like to use she/her pronoun and name - Richard Raymond - parents are aware and appears supportive - Referred to endocrine by PCP, has an appointment in 05/22 as he missed his appointment in 02/22   # Collaborate with PCP for concerns regarding AIP. He was previously referred to GI by PCP however did not follow up.    This note was generated in part or whole with voice recognition software. Voice recognition is usually quite accurate but there are transcription errors that can and very often do occur. I apologize for any typographical errors that were not detected and corrected.  30 minutes total time for encounter today which included chart review, pt evaluation, collaterals, medication and other treatment discussions, medication orders and charting.        Richard Smalling, MD   01/26/21  2:30 pm

## 2021-01-26 NOTE — Telephone Encounter (Signed)
Created in error

## 2021-01-31 ENCOUNTER — Ambulatory Visit (HOSPITAL_COMMUNITY): Payer: Managed Care, Other (non HMO)

## 2021-02-01 ENCOUNTER — Other Ambulatory Visit: Payer: Self-pay

## 2021-02-01 ENCOUNTER — Ambulatory Visit (HOSPITAL_COMMUNITY)
Admission: RE | Admit: 2021-02-01 | Discharge: 2021-02-01 | Disposition: A | Discharge status: Home / Self Care | Payer: Managed Care, Other (non HMO) | Source: Ambulatory Visit | Attending: Family Medicine | Admitting: Family Medicine

## 2021-02-01 ENCOUNTER — Ambulatory Visit (HOSPITAL_COMMUNITY): Admission: RE | Admit: 2021-02-01 | Payer: Managed Care, Other (non HMO) | Source: Ambulatory Visit

## 2021-02-01 DIAGNOSIS — R2681 Unsteadiness on feet: Secondary | ICD-10-CM | POA: Insufficient documentation

## 2021-02-01 DIAGNOSIS — R42 Dizziness and giddiness: Secondary | ICD-10-CM | POA: Diagnosis present

## 2021-02-01 DIAGNOSIS — R270 Ataxia, unspecified: Secondary | ICD-10-CM | POA: Diagnosis not present

## 2021-02-01 DIAGNOSIS — H814 Vertigo of central origin: Secondary | ICD-10-CM | POA: Insufficient documentation

## 2021-02-01 DIAGNOSIS — R519 Headache, unspecified: Secondary | ICD-10-CM | POA: Diagnosis present

## 2021-02-09 ENCOUNTER — Other Ambulatory Visit: Payer: Self-pay | Admitting: Child and Adolescent Psychiatry

## 2021-02-09 DIAGNOSIS — F3341 Major depressive disorder, recurrent, in partial remission: Secondary | ICD-10-CM

## 2021-02-09 DIAGNOSIS — F418 Other specified anxiety disorders: Secondary | ICD-10-CM

## 2021-02-09 DIAGNOSIS — F422 Mixed obsessional thoughts and acts: Secondary | ICD-10-CM

## 2021-02-12 ENCOUNTER — Other Ambulatory Visit: Payer: Self-pay

## 2021-02-12 ENCOUNTER — Ambulatory Visit (INDEPENDENT_AMBULATORY_CARE_PROVIDER_SITE_OTHER): Payer: Managed Care, Other (non HMO) | Admitting: Family Medicine

## 2021-02-12 ENCOUNTER — Encounter: Payer: Self-pay | Admitting: Family Medicine

## 2021-02-12 VITALS — BP 118/74 | HR 114 | Temp 98.2°F | Ht 64.0 in | Wt 208.0 lb

## 2021-02-12 DIAGNOSIS — R260 Ataxic gait: Secondary | ICD-10-CM | POA: Diagnosis not present

## 2021-02-12 DIAGNOSIS — R2689 Other abnormalities of gait and mobility: Secondary | ICD-10-CM

## 2021-02-12 NOTE — Progress Notes (Signed)
   Subjective:    Patient ID: Richard Raymond, adult    DOB: 2001-03-12, 19 y.o.   MRN: [id number]  HPIfollow up on dizziness. Pt states dizziness is worse that when last seen. Taking meclizine.  Richard Raymond comes in today for follow-up having significant increased dizziness states head feels like it is going in multiple directions plus also intermittent headaches MRI was normal denies muscle weakness.  Energy level subpar.   Review of Systems  Constitutional: Negative for activity change, appetite change and fatigue.  HENT: Negative for congestion.   Respiratory: Negative for cough.   Cardiovascular: Negative for chest pain.  Gastrointestinal: Negative for abdominal pain.  Skin: Negative for color change.  Neurological: Negative for headaches.  Psychiatric/Behavioral: Negative for behavioral problems.       Objective:   Physical Exam Vitals reviewed.  Constitutional:      General: She is not in acute distress. HENT:     Head: Normocephalic and atraumatic.  Eyes:     General:        Right eye: No discharge.        Left eye: No discharge.  Neck:     Trachea: No tracheal deviation.  Cardiovascular:     Rate and Rhythm: Normal rate and regular rhythm.     Heart sounds: Normal heart sounds. No murmur heard.   Pulmonary:     Effort: Pulmonary effort is normal. No respiratory distress.     Breath sounds: Normal breath sounds.  Skin:    General: Skin is warm and dry.  Neurological:     Mental Status: She is alert.     Coordination: Coordination normal.  Psychiatric:        Behavior: Behavior normal.           Assessment & Plan:  1. Vestibular ataxic gait Neurology would be a good idea for this patient because it is hard to discern if the headaches and the dizziness are related together.  Patient denies feeling stressed.  But it is possible that it could be a component. - Ambulatory referral to Neurology - Ambulatory referral to Physical Therapy We will go ahead with  referral to neurology Also referral for vestibular rehab 2. Balance problem Patient has significant ataxia when he walks. - Ambulatory referral to Neurology - Ambulatory referral to Physical Therapy

## 2021-02-23 ENCOUNTER — Other Ambulatory Visit: Payer: Self-pay

## 2021-02-23 ENCOUNTER — Telehealth (INDEPENDENT_AMBULATORY_CARE_PROVIDER_SITE_OTHER): Payer: 59 | Admitting: Child and Adolescent Psychiatry

## 2021-02-23 DIAGNOSIS — F331 Major depressive disorder, recurrent, moderate: Secondary | ICD-10-CM | POA: Diagnosis not present

## 2021-02-23 DIAGNOSIS — F422 Mixed obsessional thoughts and acts: Secondary | ICD-10-CM | POA: Diagnosis not present

## 2021-02-23 DIAGNOSIS — F418 Other specified anxiety disorders: Secondary | ICD-10-CM | POA: Diagnosis not present

## 2021-02-23 NOTE — Progress Notes (Signed)
Virtual Visit via Video Note  I connected with Richard Raymond on 02/23/21 at  9:30 AM EDT by a video enabled telemedicine application and verified that I am speaking with the correct person using two identifiers.  Location: Patient: home Provider: office   I discussed the limitations of evaluation and management by telemedicine and the availability of in person appointments. The patient expressed understanding and agreed to proceed.  I discussed the assessment and treatment plan with the patient. The patient was provided an opportunity to ask questions and all were answered. The patient agreed with the plan and demonstrated an understanding of the instructions.   The patient was advised to call back or seek an in-person evaluation if the symptoms worsen or if the condition fails to improve as anticipated.  I provided 30 minutes of non-face-to-face time during this encounter.   [name], MD        [facility] MD/PA/NP OP Progress Note 02/23/21 10:30 am Richard Raymond  MRN:  [id number]  Synopsis: This is a 20 year old Caucasian, assigned male at birth, currently identifying his transgender male preferring for now she/her and name "Richard Raymond".    Chief Complaint: Medication management follow-up. HPI:  Richard Raymond was seen and evaluated over telemedicine encounter for medication management follow-up.  In the interim since last appointment she apparently had an appointment with her PCP who has referred her to neurologist for her ongoing dizziness and also physical therapy for vestibular ataxia.  She also had an MRI of the brain done which was normal.  She reports that she continues to have constant dizziness, headaches, difficulties walking and hopeful that her physical therapy that is starting from Monday will be helpful.  She reports that because of her dizziness her mood is exhausted and impatient.  She reports that she does have periods in which she is laughing and feeling herself.  She also  reports that he enjoys playing her video games with online friends.  She reports that she is sleeping about 8 to 9 hours at night, goes to bed very late and wakes up late in the morning, does feel tired a lot, denies problems with appetite, denies any suicidal thoughts or self-harm thoughts.  She reports that her anxiety is manageable.  She reports that she has been compliant to her medications and denies any side effects from it.  We discussed to increase the dose of Prozac to 40 mg once a day given partial improvement with her symptoms.  We also discussed about considering therapy again, she does not want to go back to the therapist that she used to see in 2020, reports that she will reach out to her PCP to get a therapy referral old look into [email or web address] for therapy. She has prescriptions for Prozac 10 mg and she was recommended to take 4 pills instead to make total dose of 40 mg.  She verbalized understanding.  We discussed her follow-up in 6 weeks or earlier if needed.  Visit Diagnosis:    ICD-10-CM   1. Moderate episode of recurrent major depressive disorder (HCC)  F33.1   2. Mixed obsessional thoughts and acts  F42.2   3. Other specified anxiety disorders  F41.8     Past Psychiatric History:reviewed today no change from last visit, psychiatric hospitalization at [facility] for a week at the beginning of August. Discontinued Prozac 60 mg daily by self in 01/2020, back on 20 mg daily from 02/2020 but only partially compliant.  She also started seeing Mr. Ivin Booty  Sheets for ind therapy but non compliant.   Past Medical History:  Past Medical History:  Diagnosis Date  . Abdominal migraine   . Medical history non-contributory     Past Surgical History:  Procedure Laterality Date  . WISDOM TOOTH EXTRACTION      Family Psychiatric History: reviewed today, no change    Family History:  Family History  Problem Relation Age of Onset  . ADD / ADHD Brother     Social History:  Social  History   Socioeconomic History  . Marital status: Single    Spouse name: Not on file  . Number of children: 0  . Years of education: Not on file  . Highest education level: 12th grade  Occupational History  . Not on file  Tobacco Use  . Smoking status: Never Smoker  . Smokeless tobacco: Never Used  Vaping Use  . Vaping Use: Never used  Substance and Sexual Activity  . Alcohol use: No  . Drug use: No  . Sexual activity: Not Currently  Other Topics Concern  . Not on file  Social History Narrative  . Not on file   Social Determinants of Health   Financial Resource Strain: Not on file  Food Insecurity: Not on file  Transportation Needs: Not on file  Physical Activity: Not on file  Stress: Not on file  Social Connections: Not on file    Allergies:  Allergies  Allergen Reactions  . Cefzil [Cefprozil]     Diarrhea    Metabolic Disorder Labs: Lab Results  Component Value Date   HGBA1C 5.3 06/28/2019   MPG 105.41 06/28/2019   Lab Results  Component Value Date   PROLACTIN 9.2 01/23/2021   PROLACTIN 17.5 (H) 06/28/2019   Lab Results  Component Value Date   CHOL 197 (H) 06/28/2019   TRIG 87 06/28/2019   HDL 29 (L) 06/28/2019   CHOLHDL 6.8 06/28/2019   VLDL 17 06/28/2019   LDLCALC 151 (H) 06/28/2019   Lab Results  Component Value Date   TSH 1.930 06/22/2019    Therapeutic Level Labs: No results found for: LITHIUM No results found for: VALPROATE No components found for:  CBMZ  Current Medications: Current Outpatient Medications  Medication Sig Dispense Refill  . albuterol (VENTOLIN HFA) 108 (90 Base) MCG/ACT inhaler Inhale 2 puffs into the lungs every 6 (six) hours as needed for wheezing or shortness of breath. 8 g 0  . FLUoxetine (PROZAC) 10 MG capsule TAKE 3 CAPSULES BY MOUTH DAILY. 270 capsule 1  . hydrOXYzine (ATARAX/VISTARIL) 25 MG tablet Take 1 tablet (25 mg total) by mouth at bedtime as needed for anxiety (sleep). 90 tablet 0  . MECLIZINE HCL PO  Take by mouth.     No current facility-administered medications for this visit.     Musculoskeletal: Strength & Muscle Tone:unable to assess since visit was over the telemedicine. Gait & Station:unable to assess since visit was over the telemedicine. Patient leans: N/A  Psychiatric Specialty Exam: ROSReview of 12 systems negative except as mentioned in HPI   Mental Status Exam: Appearance: casually dressed; well groomed; no overt signs of trauma or distress noted Attitude: calm, cooperative with fair eye contact Activity: No PMA/PMR, no tics/no tremors; no EPS noted  Speech: normal rate, rhythm and volume Thought Process: Logical, linear, and goal-directed.  Associations: no looseness, tangentiality, circumstantiality, flight of ideas, thought blocking or word salad noted Thought Content: (abnormal/psychotic thoughts): no abnormal or delusional thought process evidenced SI/HI: denies Si/Hi Perception: no  illusions or visual/auditory hallucinations noted; no response to internal stimuli demonstrated Mood & Affect: "exhausted"/restricted Judgment & Insight: both fair Attention and Concentration : Good Cognition : WNL Language : Good ADL - Intact       Assessment and Plan:   # Depression (recurrent, moderate) - Increase Prozac to 40 mg daily, psychoeducation on med adherence provided as mentioned above.   - He did not want to restart therapy but agrees to reconsider after he is physically well.     # Anxiety (chronic and worse) - anxiety appears to have worsened in the context of recent physical health issues. Meds as mentioned.  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  - Same as mentioned above.   # OCD (improved)  # Asperger's syndrome/ASD (chronic) - Mother's report of some sensory issues, difficulties with transition when he was young, concreteness, concerning for Asperger's  syndrome. -  He is currently undergoing eval for ASD.   # Gender dysphoria - at present would like to use she/her pronoun and name - Richard Raymond - parents are aware and appears supportive - Referred to endocrine by PCP, has an appointment in 05/22 as he missed his appointment in 02/22   # Previously there were concerns for AIP, she was referred to GI but never followed up, reviewing with him and looking at literature, AIP appears less likely.     This note was generated in part or whole with voice recognition software. Voice recognition is usually quite accurate but there are transcription errors that can and very often do occur. I apologize for any typographical errors that were not detected and corrected.  30 minutes total time for encounter today which included chart review, pt evaluation, collaterals, medication and other treatment discussions, medication orders and charting.        [name], MD   02/23/21  2:30 pm

## 2021-02-26 ENCOUNTER — Other Ambulatory Visit: Payer: Self-pay

## 2021-02-26 ENCOUNTER — Ambulatory Visit (HOSPITAL_COMMUNITY): Payer: Managed Care, Other (non HMO) | Attending: Family Medicine | Admitting: Physical Therapy

## 2021-02-26 DIAGNOSIS — R2689 Other abnormalities of gait and mobility: Secondary | ICD-10-CM | POA: Insufficient documentation

## 2021-02-26 DIAGNOSIS — M6281 Muscle weakness (generalized): Secondary | ICD-10-CM | POA: Diagnosis present

## 2021-02-26 DIAGNOSIS — R42 Dizziness and giddiness: Secondary | ICD-10-CM | POA: Diagnosis present

## 2021-02-26 NOTE — Therapy (Signed)
[facility] [address] [place], [place], [address] Phone: [phone]   Fax:  [phone]  Physical Therapy Evaluation  Patient Details  Name: Richard Raymond MRN: [id number] Date of Birth: 06/17/2001 Referring Provider (PT): Richard Raymond   Encounter Date: 02/26/2021   PT End of Session - 02/26/21 1656    Visit Number 1    Number of Visits 8    Date for PT Re-Evaluation 03/28/21    Authorization Type [employer]    Progress Note Due on Visit 8    PT Start Time 1620    PT Stop Time 1655    PT Time Calculation (min) 35 min           Past Medical History:  Diagnosis Date  . Abdominal migraine   . Medical history non-contributory     Past Surgical History:  Procedure Laterality Date  . WISDOM TOOTH EXTRACTION      There were no vitals filed for this visit.    Subjective Assessment - 02/26/21 1627    Subjective Ms. Richard Raymond states that he had two negative covid tests but he had the symptoms.  Ever since she has been dizzy to the point where he needs a quad cane to walk with.   MRI and blood test normal.    Limitations Walking;Standing    How long can you sit comfortably? no problem    How long can you stand comfortably? not sure    How long can you walk comfortably? fifteen minutes with his cane becomes taxing    Patient Stated Goals better balance able to walk without an assistive device    Currently in Pain? No/denies   headaches             [facility] PT Assessment - 02/26/21 0001      Assessment   Medical Diagnosis unsteady gait    Referring Provider (PT) Lorin Picket Richard Raymond    Onset Date/Surgical Date 12/25/20    Next MD Visit Richard Raymond    Prior Therapy none      Precautions   Precautions Fall      Restrictions   Weight Bearing Restrictions No      Balance Screen   Has the patient fallen in the past 6 months No    Has the patient had a decrease in activity level because of a fear of falling?  Yes    Is the patient  reluctant to leave their home because of a fear of falling?  Yes      Home Environment   Living Environment Private residence    Type of Home House    Home Access Stairs to enter   stays with mother as one story father is two     Prior Function   Level of Independence Independent      Cognition   Overall Cognitive Status Within Functional Limits for tasks assessed      Balance   Balance Assessed Yes      Static Standing Balance   Static Standing - Balance Support No upper extremity supported   fair   Static Standing - Level of Assistance 5: Stand by assistance    Static Standing - Comment/# of Minutes 7 minutes      Standardized Balance Assessment   Standardized Balance Assessment ;Five Times Sit to Stand    Balance Master Testing --   single leg stance: LT 4 ; RT 3 seconds   Five times sit to stand comments  40 seconds to come sit to stand 5 times                  Vestibular Assessment - 02/26/21 0001      Oculomotor Exam   Smooth Pursuits Saccades    Saccades Overshoots;Poor trajectory      Vestibulo-Ocular Reflex   VOR 1 Head Only (x 1 viewing) mild dizziness    VOR 2 Head and Object (x 2 viewing) mild dizziness      Positional Testing   Dix-Hallpike Dix-Hallpike Right;Dix-Hallpike Left      Dix-Hallpike Right   Dix-Hallpike Right Symptoms No nystagmus      Dix-Hallpike Left   Dix-Hallpike Left Symptoms No nystagmus              Objective measurements completed on examination: See above findings.            Balance Exercises - 02/26/21 0001      Balance Exercises: Standing   Sit to Stand Standard surface   x5   Other Standing Exercises standing no assistive device x 7 mintues      Balance Exercises: Seated   Other Seated Exercises smooth pursuit vertical and horizontal; gaze stability x 5 each             PT Education - 02/26/21 1650    Education Details HEP    Person(s) Educated Patient    Methods  Explanation;Demonstration;Handout;Verbal cues    Comprehension Verbalized understanding;Returned demonstration            PT Short Term Goals - 02/26/21 1704      PT SHORT TERM GOAL #1   Title PT to be I in HEP to improve balance to be able to single leg stance on each LE for 10 seconds to decrease the risk of falling    Time 2    Period Weeks    Status New    Target Date 03/12/21      PT SHORT TERM GOAL #2   Title PT to be able to complete 20 sit to stand in a 30 second period of time to rate below average    Time 2    Period Weeks    Status New             PT Long Term Goals - 02/26/21 1706      PT LONG TERM GOAL #1   Title Pt to be able to single leg stance for 20 seconds on both LE to be able to feel confident walking without an assistive device    Time 4    Period Weeks    Status New    Target Date 03/26/21      PT LONG TERM GOAL #2   Title Pt to be able to complete 30 sit to stand in a 30 second period to rate average for age group.    Time 4    Period Weeks    Status New      PT LONG TERM GOAL #3   Title PT to be able to go up and down 15 steps with one hand rail in order to feel more comfortable at fathers home    Time 4    Period Weeks    Status New                  Plan - 02/26/21 1657    Clinical Impression Statement Ms. Richard Raymond is a 20 yo who states that she had  covid in January of this year.  Following the exposure she has not been able to balance herself properly and is walking with a quad cane so that she is not reaching for things to stabilze her.  Evaluation demonstrates decreased balanc and functional strength, negative dix halpike.  Ms. Richard Raymond will benefit from skilled therapy to improve her balance and walking ability.  Of note her quad cane is to tall but pt does not want it to be adjucted.    Personal Factors and Comorbidities Time since onset of injury/illness/exacerbation    Examination-Activity Limitations Stairs;Locomotion  Level;Carry    Examination-Participation Restrictions Cleaning;Community Activity;Laundry;Meal Prep;Yard Work;Shop    Stability/Clinical Decision Making Stable/Uncomplicated    Clinical Decision Making Moderate    Rehab Potential Fair    PT Frequency 2x / week    PT Duration 4 weeks    PT Treatment/Interventions Patient/family education;Neuromuscular re-education;Balance training;Therapeutic exercise;Therapeutic activities;Functional mobility training;Stair training;DME Instruction;Gait training    PT Next Visit Plan Begin tandem stance, standing with head turns, narrow base of support, heel raises, step ups, functional squat and lunges; progress to walking without assistive device.    PT Home Exercise Plan sit to stand, standing without UE support, vertical and horizontal smooth pursuits and gaze stabilizationl           Patient will benefit from skilled therapeutic intervention in order to improve the following deficits and impairments:  Abnormal gait,Decreased activity tolerance,Decreased balance,Decreased strength,Difficulty walking,Dizziness,Postural dysfunction  Visit Diagnosis: Other abnormalities of gait and mobility  Muscle weakness (generalized)  Dizziness and giddiness     Problem List Patient Active Problem List   Diagnosis Date Noted  . Close exposure to COVID-19 virus 12/13/2020  . Cough in adult 12/13/2020  . Mild episode of recurrent major depressive disorder (HCC) 08/28/2019  . Other specified anxiety disorders 05/06/2019  . Mixed obsessional thoughts and acts 05/06/2019    [name], PT CLT [phone] 02/26/2021, 5:09 PM  [facility] [address] [place], [place], [address] Phone: [phone]   Fax:  [phone]  Name: Richard Raymond MRN: [id number] Date of Birth: [birth year]

## 2021-02-28 ENCOUNTER — Ambulatory Visit (HOSPITAL_COMMUNITY): Payer: Managed Care, Other (non HMO) | Admitting: Physical Therapy

## 2021-02-28 ENCOUNTER — Other Ambulatory Visit: Payer: Self-pay

## 2021-02-28 DIAGNOSIS — R2689 Other abnormalities of gait and mobility: Secondary | ICD-10-CM

## 2021-02-28 DIAGNOSIS — M6281 Muscle weakness (generalized): Secondary | ICD-10-CM

## 2021-02-28 DIAGNOSIS — R42 Dizziness and giddiness: Secondary | ICD-10-CM

## 2021-02-28 NOTE — Therapy (Signed)
[facility] [address] [place], [place], [address] Phone: [phone]   Fax:  [phone]  Physical Therapy Treatment  Patient Details  Name: Richard Raymond MRN: [id number] Date of Birth: 05-Mar-2001 Referring Provider (PT): Lilyan Punt   Encounter Date: 02/28/2021   PT End of Session - 02/28/21 1708    Visit Number 2    Number of Visits 8    Date for PT Re-Evaluation 03/28/21    Authorization Type [employer]    Progress Note Due on Visit 8    PT Start Time 1620    PT Stop Time 1704    PT Time Calculation (min) 44 min           Past Medical History:  Diagnosis Date  . Abdominal migraine   . Medical history non-contributory     Past Surgical History:  Procedure Laterality Date  . WISDOM TOOTH EXTRACTION      There were no vitals filed for this visit.   Subjective Assessment - 02/28/21 1626    Subjective pt states daily headaches and dizzines.  STates dizziness is worse today than last visit.  Reports compliance with HEP. Reports her goal is walk without AD.    Currently in Pain? No/denies                             [facility] Adult PT Treatment/Exercise - 02/28/21 0001      Knee/Hip Exercises: Standing   Heel Raises Both;10 reps    Forward Lunges Both;10 reps    Forward Lunges Limitations onto 4" step intermittent HHA    Hip ADduction Both;10 reps    SLS with Vectors both 5" 5 reps with 1 HHA    Other Standing Knee Exercises tandem stance 3X each without UE assist 15" max      Knee/Hip Exercises: Seated   Sit to Sand 5 reps;without UE support                    PT Short Term Goals - 02/28/21 1657      PT SHORT TERM GOAL #1   Title PT to be I in HEP to improve balance to be able to single leg stance on each LE for 10 seconds to decrease the risk of falling    Time 2    Period Weeks    Status On-going    Target Date 03/12/21      PT SHORT TERM GOAL #2   Title PT to be able to complete 20  sit to stand in a 30 second period of time to rate below average    Time 2    Period Weeks    Status On-going             PT Long Term Goals - 02/28/21 1658      PT LONG TERM GOAL #1   Title Pt to be able to single leg stance for 20 seconds on both LE to be able to feel confident walking without an assistive device    Time 4    Period Weeks    Status On-going      PT LONG TERM GOAL #2   Title Pt to be able to complete 30 sit to stand in a 30 second period to rate average for age group.    Time 4    Period Weeks    Status On-going  PT LONG TERM GOAL #3   Title PT to be able to go up and down 15 steps with one hand rail in order to feel more comfortable at fathers home    Time 4    Period Weeks    Status On-going                 Plan - 02/28/21 1706    Clinical Impression Statement Reviewed goals and POC moving forward.  Pt able to recall all HEP given and reports compliance.  Began LE strengthening and stability exercises in standing.  Pt with visual shakiness/weakness but able to self correct all LOB and rested when needed.  Max tandem stance 15" with either LE lead and no UE's.  Added vectors to help improve this.  Pt required cues to keep count of therex and for general form and posturing.  Able to complete all requested activities. Updated HEP to include vector stance.    Personal Factors and Comorbidities Time since onset of injury/illness/exacerbation    Examination-Activity Limitations Stairs;Locomotion Level;Carry    Examination-Participation Restrictions Cleaning;Community Activity;Laundry;Meal Prep;Yard Work;Shop    Stability/Clinical Decision Making Stable/Uncomplicated    Rehab Potential Fair    PT Frequency 2x / week    PT Duration 4 weeks    PT Treatment/Interventions Patient/family education;Neuromuscular re-education;Balance training;Therapeutic exercise;Therapeutic activities;Functional mobility training;Stair training;DME Instruction;Gait training     PT Next Visit Plan Begin standing with head turns, narrow base of support,step ups and functional squats.  Progress to walking without assistive device.    PT Home Exercise Plan sit to stand, standing without UE support, vertical and horizontal smooth pursuits and gaze stabilizationl  4/6:  vector stance with 1 HHA           Patient will benefit from skilled therapeutic intervention in order to improve the following deficits and impairments:  Abnormal gait,Decreased activity tolerance,Decreased balance,Decreased strength,Difficulty walking,Dizziness,Postural dysfunction  Visit Diagnosis: Dizziness and giddiness  Muscle weakness (generalized)  Other abnormalities of gait and mobility     Problem List Patient Active Problem List   Diagnosis Date Noted  . Close exposure to COVID-19 virus 12/13/2020  . Cough in adult 12/13/2020  . Mild episode of recurrent major depressive disorder (HCC) 08/28/2019  . Other specified anxiety disorders 05/06/2019  . Mixed obsessional thoughts and acts 05/06/2019   Lurena Nida, PTA/CLT [phone]  Lurena Nida 02/28/2021, 5:09 PM  [facility] [address] [place], [place], [address] Phone: [phone]   Fax:  [phone]  Name: Rui Wordell MRN: [id number] Date of Birth: [birth year]

## 2021-03-06 ENCOUNTER — Ambulatory Visit (HOSPITAL_COMMUNITY): Payer: Managed Care, Other (non HMO) | Admitting: Physical Therapy

## 2021-03-06 ENCOUNTER — Telehealth (HOSPITAL_COMMUNITY): Payer: Self-pay | Admitting: Physical Therapy

## 2021-03-06 ENCOUNTER — Encounter: Payer: Self-pay | Admitting: Family Medicine

## 2021-03-06 NOTE — Telephone Encounter (Signed)
Seen feb and march for ataxia

## 2021-03-06 NOTE — Telephone Encounter (Signed)
pt mother cancelled appt for today because the pt is sick 

## 2021-03-07 ENCOUNTER — Encounter: Payer: Self-pay | Admitting: Family Medicine

## 2021-03-07 NOTE — Telephone Encounter (Signed)
Letter was dictated please make sure patient gets this either via MyChart or mail etc. thank you

## 2021-03-08 ENCOUNTER — Other Ambulatory Visit: Payer: Self-pay

## 2021-03-08 ENCOUNTER — Ambulatory Visit (HOSPITAL_COMMUNITY): Payer: Managed Care, Other (non HMO) | Admitting: Physical Therapy

## 2021-03-08 DIAGNOSIS — R2689 Other abnormalities of gait and mobility: Secondary | ICD-10-CM

## 2021-03-08 DIAGNOSIS — R42 Dizziness and giddiness: Secondary | ICD-10-CM

## 2021-03-08 DIAGNOSIS — M6281 Muscle weakness (generalized): Secondary | ICD-10-CM

## 2021-03-08 NOTE — Therapy (Signed)
[facility] [address] [place], [place], [address] Phone: [phone]   Fax:  [phone]  Physical Therapy Treatment  Patient Details  Name: Richard Raymond MRN: [id number] Date of Birth: 2001-05-04 Referring Provider (PT): Lilyan Punt   Encounter Date: 03/08/2021   PT End of Session - 03/08/21 1629    Visit Number 3    Number of Visits 8    Date for PT Re-Evaluation 03/28/21    Authorization Type [employer]    Progress Note Due on Visit 8    PT Start Time 1620    PT Stop Time 1700    PT Time Calculation (min) 40 min    Activity Tolerance Patient tolerated treatment well    Behavior During Therapy [name] for tasks assessed/performed           Past Medical History:  Diagnosis Date  . Abdominal migraine   . Medical history non-contributory     Past Surgical History:  Procedure Laterality Date  . WISDOM TOOTH EXTRACTION      There were no vitals filed for this visit.   Subjective Assessment - 03/08/21 1619    Subjective Pt states that she has been under a lot of stress this week.  She has been trying to get the exercises completed.    Limitations Walking;Standing    How long can you sit comfortably? no problem    How long can you stand comfortably? not sure    How long can you walk comfortably? fifteen minutes with his cane becomes taxing    Patient Stated Goals better balance able to walk without an assistive device    Currently in Pain? No/denies                   [facility] Adult PT Treatment/Exercise - 03/08/21 0001      Exercises   Exercises Knee/Hip      Knee/Hip Exercises: Aerobic   Nustep hills 3 level 2 x 5'      Knee/Hip Exercises: Standing   Forward Lunges Both;10 reps    Functional Squat 10 reps    SLS with Vectors both 5" 5 reps with 1 HHA    Other Standing Knee Exercises tandem stance 3X each without UE assist 15" max    Other Standing Knee Exercises wall arch x 10      Knee/Hip Exercises: Seated    Sit to Sand 10 reps               Balance Exercises - 03/08/21 0001      Balance Exercises: Standing   Standing Eyes Closed Narrow base of support (BOS);Solid surface;3 reps;10 secs   with head turns   SLS with Vectors Solid surface;3 reps;10 secs    Tandem Gait Forward;2 reps    Marching Solid surface;10 reps               PT Short Term Goals - 02/28/21 1657      PT SHORT TERM GOAL #1   Title PT to be I in HEP to improve balance to be able to single leg stance on each LE for 10 seconds to decrease the risk of falling    Time 2    Period Weeks    Status On-going    Target Date 03/12/21      PT SHORT TERM GOAL #2   Title PT to be able to complete 20 sit to stand in a 30 second period of time to  rate below average    Time 2    Period Weeks    Status On-going             PT Long Term Goals - 02/28/21 1658      PT LONG TERM GOAL #1   Title Pt to be able to single leg stance for 20 seconds on both LE to be able to feel confident walking without an assistive device    Time 4    Period Weeks    Status On-going      PT LONG TERM GOAL #2   Title Pt to be able to complete 30 sit to stand in a 30 second period to rate average for age group.    Time 4    Period Weeks    Status On-going      PT LONG TERM GOAL #3   Title PT to be able to go up and down 15 steps with one hand rail in order to feel more comfortable at fathers home    Time 4    Period Weeks    Status On-going                 Plan - 03/08/21 1629    Clinical Impression Statement Added balance activites working on keeping shoulders over center of gravity.  Pt needs to be reminded to keep her core engaged throughout treatment session.  PT demonstrates unsteadiness but is able to self correct.    Personal Factors and Comorbidities Time since onset of injury/illness/exacerbation    Examination-Activity Limitations Stairs;Locomotion Level;Carry    Examination-Participation Restrictions  Cleaning;Community Activity;Laundry;Meal Prep;Yard Work;Shop    Stability/Clinical Decision Making Stable/Uncomplicated    Rehab Potential Fair    PT Frequency 2x / week    PT Duration 4 weeks    PT Treatment/Interventions Patient/family education;Neuromuscular re-education;Balance training;Therapeutic exercise;Therapeutic activities;Functional mobility training;Stair training;DME Instruction;Gait training    PT Next Visit Plan .  Progress to walking without assistive device.    PT Home Exercise Plan sit to stand, standing without UE support, vertical and horizontal smooth pursuits and gaze stabilizationl  4/6:  vector stance with 1 HHA           Patient will benefit from skilled therapeutic intervention in order to improve the following deficits and impairments:  Abnormal gait,Decreased activity tolerance,Decreased balance,Decreased strength,Difficulty walking,Dizziness,Postural dysfunction  Visit Diagnosis: Dizziness and giddiness  Muscle weakness (generalized)  Other abnormalities of gait and mobility     Problem List Patient Active Problem List   Diagnosis Date Noted  . Close exposure to COVID-19 virus 12/13/2020  . Cough in adult 12/13/2020  . Mild episode of recurrent major depressive disorder (HCC) 08/28/2019  . Other specified anxiety disorders 05/06/2019  . Mixed obsessional thoughts and acts 05/06/2019  [name], PT CLT [phone] 03/08/2021, 5:17 PM   [facility] [address] [place], [place], [address] Phone: [phone]   Fax:  [phone]  Name: Richard Raymond MRN: [id number] Date of Birth: 01-31-2001

## 2021-03-13 ENCOUNTER — Encounter (HOSPITAL_COMMUNITY): Payer: Managed Care, Other (non HMO)

## 2021-03-14 ENCOUNTER — Encounter (HOSPITAL_COMMUNITY): Payer: Self-pay

## 2021-03-14 ENCOUNTER — Ambulatory Visit (HOSPITAL_COMMUNITY): Payer: Managed Care, Other (non HMO)

## 2021-03-14 ENCOUNTER — Other Ambulatory Visit: Payer: Self-pay

## 2021-03-14 DIAGNOSIS — M6281 Muscle weakness (generalized): Secondary | ICD-10-CM

## 2021-03-14 DIAGNOSIS — R42 Dizziness and giddiness: Secondary | ICD-10-CM

## 2021-03-14 DIAGNOSIS — R2689 Other abnormalities of gait and mobility: Secondary | ICD-10-CM

## 2021-03-14 NOTE — Therapy (Signed)
[facility] [address] [place], [place], [address] Phone: [phone]   Fax:  [phone]  Physical Therapy Treatment  Patient Details  Name: Richard Raymond MRN: [id number] Date of Birth: [birth year] Referring Provider (PT): Lilyan Punt   Encounter Date: 03/14/2021   PT End of Session - 03/14/21 1551    Visit Number 4    Number of Visits 8    Date for PT Re-Evaluation 03/28/21    Authorization Type [employer]    Progress Note Due on Visit 8    PT Start Time 1535    PT Stop Time 1618    PT Time Calculation (min) 43 min    Activity Tolerance Patient tolerated treatment well    Behavior During Therapy [facility] for tasks assessed/performed           Past Medical History:  Diagnosis Date  . Abdominal migraine   . Medical history non-contributory     Past Surgical History:  Procedure Laterality Date  . WISDOM TOOTH EXTRACTION      There were no vitals filed for this visit.   Subjective Assessment - 03/14/21 1548    Subjective Pt stated she has been under a lot of stress this week, reports decreased HEP compliance the last couple days.  No reports of pain.  Got 3 hrs of sleep last night.  Reports constant dizziness rated 5/10 intensity.    Patient Stated Goals better balance able to walk without an assistive device    Currently in Pain? No/denies                             [facility] Adult PT Treatment/Exercise - 03/14/21 0001      Ambulation/Gait   Ambulation/Gait Yes    Ambulation Distance (Feet) 200 Feet    Assistive device Small based quad cane    Gait Pattern Step-through pattern    Gait Comments Cueing for 3 point sequence and to improve cane alignment      Exercises   Exercises Knee/Hip      Knee/Hip Exercises: Standing   Forward Lunges Both;15 reps    Forward Lunges Limitations no HHA    Functional Squat 10 reps    SLS with Vectors 5x 10" intermittent HHA    Gait Training Gait training no AD x 27ft,  required min A for safety with LOB tendency to lean to Rt               Balance Exercises - 03/14/21 0001      Balance Exercises: Standing   SLS with Vectors Solid surface;3 reps;10 secs    Rockerboard Lateral   2 min\   Tandem Gait Forward;2 reps    Marching Solid surface;10 reps   no HHA              PT Short Term Goals - 02/28/21 1657      PT SHORT TERM GOAL #1   Title PT to be I in HEP to improve balance to be able to single leg stance on each LE for 10 seconds to decrease the risk of falling    Time 2    Period Weeks    Status On-going    Target Date 03/12/21      PT SHORT TERM GOAL #2   Title PT to be able to complete 20 sit to stand in a 30 second period of time to rate below average  Time 2    Period Weeks    Status On-going             PT Long Term Goals - 02/28/21 1658      PT LONG TERM GOAL #1   Title Pt to be able to single leg stance for 20 seconds on both LE to be able to feel confident walking without an assistive device    Time 4    Period Weeks    Status On-going      PT LONG TERM GOAL #2   Title Pt to be able to complete 30 sit to stand in a 30 second period to rate average for age group.    Time 4    Period Weeks    Status On-going      PT LONG TERM GOAL #3   Title PT to be able to go up and down 15 steps with one hand rail in order to feel more comfortable at fathers home    Time 4    Period Weeks    Status On-going                 Plan - 03/14/21 1608    Clinical Impression Statement Began gait training without AD, cueing for heel to toe mechanics and required min A for LOB multiple times, pt encouraged to continue with AD for safety.  Continues with balance activities and functional strengthening, majority of exercises complete wihtout HHA.  Pt required cueing for core engangements and awareness of posutre to improve balance through session.    Personal Factors and Comorbidities Time since onset of  injury/illness/exacerbation    Examination-Activity Limitations Stairs;Locomotion Level;Carry    Examination-Participation Restrictions Cleaning;Community Activity;Laundry;Meal Prep;Yard Work;Shop    Stability/Clinical Decision Making Stable/Uncomplicated    Clinical Decision Making Moderate    Rehab Potential Fair    PT Frequency 2x / week    PT Duration 4 weeks    PT Treatment/Interventions Patient/family education;Neuromuscular re-education;Balance training;Therapeutic exercise;Therapeutic activities;Functional mobility training;Stair training;DME Instruction;Gait training    PT Next Visit Plan Progress to walking without assistive device.  Add paloff and theraband postural strengthening in NBOS for balance.    PT Home Exercise Plan sit to stand, standing without UE support, vertical and horizontal smooth pursuits and gaze stabilizationl  4/6:  vector stance with 1 HHA           Patient will benefit from skilled therapeutic intervention in order to improve the following deficits and impairments:  Abnormal gait,Decreased activity tolerance,Decreased balance,Decreased strength,Difficulty walking,Dizziness,Postural dysfunction  Visit Diagnosis: Muscle weakness (generalized)  Other abnormalities of gait and mobility  Dizziness and giddiness     Problem List Patient Active Problem List   Diagnosis Date Noted  . Close exposure to COVID-19 virus 12/13/2020  . Cough in adult 12/13/2020  . Mild episode of recurrent major depressive disorder (HCC) 08/28/2019  . Other specified anxiety disorders 05/06/2019  . Mixed obsessional thoughts and acts 05/06/2019   Becky Sax, LPTA/CLT; CBIS [phone]  Juel Burrow 03/14/2021, 4:31 PM  [facility] [address] [place], [place], [address] Phone: [phone]   Fax:  [phone]  Name: Jahad Old MRN: [id number] Date of Birth: [birth year]

## 2021-03-15 ENCOUNTER — Encounter (HOSPITAL_COMMUNITY): Payer: Managed Care, Other (non HMO)

## 2021-03-16 ENCOUNTER — Ambulatory Visit (HOSPITAL_COMMUNITY): Payer: Managed Care, Other (non HMO)

## 2021-03-16 ENCOUNTER — Encounter (HOSPITAL_COMMUNITY): Payer: Self-pay

## 2021-03-16 ENCOUNTER — Other Ambulatory Visit: Payer: Self-pay

## 2021-03-16 DIAGNOSIS — M6281 Muscle weakness (generalized): Secondary | ICD-10-CM

## 2021-03-16 DIAGNOSIS — R2689 Other abnormalities of gait and mobility: Secondary | ICD-10-CM

## 2021-03-16 DIAGNOSIS — R42 Dizziness and giddiness: Secondary | ICD-10-CM

## 2021-03-16 NOTE — Therapy (Signed)
[facility] [address] [place], [place], [address] Phone: [phone]   Fax:  [phone]  Physical Therapy Treatment  Patient Details  Name: Richard Raymond MRN: [id number] Date of Birth: Feb 19, 2001 Referring Provider (PT): Lilyan Punt   Encounter Date: 03/16/2021   PT End of Session - 03/16/21 1602    Visit Number 5    Number of Visits 8    Date for PT Re-Evaluation 03/28/21    Authorization Type [employer]    Progress Note Due on Visit 8    PT Start Time 1538   delay start with restroom break   PT Stop Time 1616    PT Time Calculation (min) 38 min    Activity Tolerance Patient tolerated treatment well    Behavior During Therapy [facility] for tasks assessed/performed           Past Medical History:  Diagnosis Date  . Abdominal migraine   . Medical history non-contributory     Past Surgical History:  Procedure Laterality Date  . WISDOM TOOTH EXTRACTION      There were no vitals filed for this visit.                      [facility] Adult PT Treatment/Exercise - 03/16/21 0001      Ambulation/Gait   Ambulation/Gait Yes    Ambulation Distance (Feet) 200 Feet    Assistive device None    Gait Pattern Step-through pattern    Gait Comments Cueing for heel to toe mechanics, posture, appropriate arm swing, tendency to scissor      Exercises   Exercises Knee/Hip      Knee/Hip Exercises: Standing   Forward Lunges Both;15 reps    Forward Lunges Limitations no HHA    Functional Squat 2 sets;10 reps    SLS with Vectors 5x 10" intermittent HHA    Gait Training Gait training no AD, 280ft cueing for heel to toe, appropriate arm swing, tendency to scissor    Other Standing Knee Exercises Paloff GTB NBOS 2x 15; postural strenghtening shoulder extension and rows 15x    Other Standing Knee Exercises sidestep 1RT                    PT Short Term Goals - 02/28/21 1657      PT SHORT TERM GOAL #1   Title PT to be I in  HEP to improve balance to be able to single leg stance on each LE for 10 seconds to decrease the risk of falling    Time 2    Period Weeks    Status On-going    Target Date 03/12/21      PT SHORT TERM GOAL #2   Title PT to be able to complete 20 sit to stand in a 30 second period of time to rate below average    Time 2    Period Weeks    Status On-going             PT Long Term Goals - 02/28/21 1658      PT LONG TERM GOAL #1   Title Pt to be able to single leg stance for 20 seconds on both LE to be able to feel confident walking without an assistive device    Time 4    Period Weeks    Status On-going      PT LONG TERM GOAL #2   Title Pt to be able to  complete 30 sit to stand in a 30 second period to rate average for age group.    Time 4    Period Weeks    Status On-going      PT LONG TERM GOAL #3   Title PT to be able to go up and down 15 steps with one hand rail in order to feel more comfortable at fathers home    Time 4    Period Weeks    Status On-going                 Plan - 03/16/21 1602    Clinical Impression Statement Pt improved mechanics with 2 point sequence and improved placement with cane, does continue to require cueing to improve cane placement though.  Decreased LOB episodes walking with no AD this session compared to last though does need cueing for equal stride length, heel to toe and to reduce scissor gait.  Added sidestep for glut med strengthening and additional core and postural strengthening.  No reports of pain, was limited by fatigue required periodic seated rest breaks.    Personal Factors and Comorbidities Time since onset of injury/illness/exacerbation    Examination-Activity Limitations Stairs;Locomotion Level;Carry    Examination-Participation Restrictions Cleaning;Community Activity;Laundry;Meal Prep;Yard Work;Shop    Stability/Clinical Decision Making Stable/Uncomplicated    Clinical Decision Making Moderate    Rehab Potential Fair     PT Frequency 2x / week    PT Duration 4 weeks    PT Treatment/Interventions Patient/family education;Neuromuscular re-education;Balance training;Therapeutic exercise;Therapeutic activities;Functional mobility training;Stair training;DME Instruction;Gait training    PT Next Visit Plan Progress to walking without assistive device.  Continue core, postural strengthening and proximal strengthening.    PT Home Exercise Plan sit to stand, standing without UE support, vertical and horizontal smooth pursuits and gaze stabilizationl  4/6:  vector stance with 1 HHA           Patient will benefit from skilled therapeutic intervention in order to improve the following deficits and impairments:  Abnormal gait,Decreased activity tolerance,Decreased balance,Decreased strength,Difficulty walking,Dizziness,Postural dysfunction  Visit Diagnosis: Other abnormalities of gait and mobility  Dizziness and giddiness  Muscle weakness (generalized)     Problem List Patient Active Problem List   Diagnosis Date Noted  . Close exposure to COVID-19 virus 12/13/2020  . Cough in adult 12/13/2020  . Mild episode of recurrent major depressive disorder (HCC) 08/28/2019  . Other specified anxiety disorders 05/06/2019  . Mixed obsessional thoughts and acts 05/06/2019   Becky Sax, LPTA/CLT; CBIS [phone]  Juel Burrow 03/16/2021, 6:15 PM  [facility] [facility] [address] [place], [place], [address] Phone: [phone]   Fax:  [phone]  Name: Laquinton Bihm MRN: [id number] Date of Birth: [birth year]

## 2021-03-20 ENCOUNTER — Encounter (HOSPITAL_COMMUNITY): Payer: Managed Care, Other (non HMO)

## 2021-03-21 ENCOUNTER — Other Ambulatory Visit: Payer: Self-pay

## 2021-03-21 ENCOUNTER — Ambulatory Visit (HOSPITAL_COMMUNITY): Payer: Managed Care, Other (non HMO) | Admitting: Physical Therapy

## 2021-03-21 DIAGNOSIS — R2689 Other abnormalities of gait and mobility: Secondary | ICD-10-CM

## 2021-03-21 DIAGNOSIS — M6281 Muscle weakness (generalized): Secondary | ICD-10-CM

## 2021-03-21 DIAGNOSIS — R42 Dizziness and giddiness: Secondary | ICD-10-CM

## 2021-03-21 NOTE — Therapy (Signed)
[facility] [address] [place], [place], [address] Phone: [phone]   Fax:  [phone]  Physical Therapy Treatment  Patient Details  Name: Richard Raymond MRN: [id number] Date of Birth: 02-14-2001 Referring Provider (PT): Lilyan Punt   Encounter Date: 03/21/2021   PT End of Session - 03/21/21 1534    Visit Number 6    Number of Visits 8    Date for PT Re-Evaluation 03/28/21    Authorization Type [employer]    Progress Note Due on Visit 8    PT Start Time 1535    PT Stop Time 1615    PT Time Calculation (min) 40 min    Activity Tolerance Patient tolerated treatment well    Behavior During Therapy [facility] for tasks assessed/performed           Past Medical History:  Diagnosis Date  . Abdominal migraine   . Medical history non-contributory     Past Surgical History:  Procedure Laterality Date  . WISDOM TOOTH EXTRACTION      There were no vitals filed for this visit.   Subjective Assessment - 03/21/21 1534    Subjective Pt states that she is doing her exercise as much as possible, she does something everyday.    Limitations Walking;Standing    How long can you sit comfortably? no problem    How long can you stand comfortably? not sure    How long can you walk comfortably? fifteen minutes with his cane becomes taxing    Patient Stated Goals better balance able to walk without an assistive device    Currently in Pain? No/denies                             [facility] Adult PT Treatment/Exercise - 03/21/21 0001      Ambulation/Gait   Ambulation Distance (Feet) 425 Feet    Assistive device None    Stairs Yes    Stairs Assistance 6: Modified independent (Device/Increase time)    Stair Management Technique One rail Right;Alternating pattern    Number of Stairs 8    Height of Stairs 7      Exercises   Exercises Knee/Hip      Knee/Hip Exercises: Aerobic   Nustep hills 3 level 2 x 5'      Knee/Hip Exercises:  Standing   Heel Raises 15 reps   no UE assist   Forward Lunges Both;15 reps    Forward Lunges Limitations no HHA    Functional Squat 2 sets;10 reps    Other Standing Knee Exercises sidestep 2RT      Knee/Hip Exercises: Seated   Sit to Sand 15 reps               Balance Exercises - 03/21/21 0001      Balance Exercises: Standing   SLS with Vectors Solid surface;3 reps;10 secs    Tandem Gait Forward;2 reps    Marching Solid surface   15              PT Short Term Goals - 02/28/21 1657      PT SHORT TERM GOAL #1   Title PT to be I in HEP to improve balance to be able to single leg stance on each LE for 10 seconds to decrease the risk of falling    Time 2    Period Weeks    Status On-going    Target  Date 03/12/21      PT SHORT TERM GOAL #2   Title PT to be able to complete 20 sit to stand in a 30 second period of time to rate below average    Time 2    Period Weeks    Status On-going             PT Long Term Goals - 02/28/21 1658      PT LONG TERM GOAL #1   Title Pt to be able to single leg stance for 20 seconds on both LE to be able to feel confident walking without an assistive device    Time 4    Period Weeks    Status On-going      PT LONG TERM GOAL #2   Title Pt to be able to complete 30 sit to stand in a 30 second period to rate average for age group.    Time 4    Period Weeks    Status On-going      PT LONG TERM GOAL #3   Title PT to be able to go up and down 15 steps with one hand rail in order to feel more comfortable at fathers home    Time 4    Period Weeks    Status On-going                 Plan - 03/21/21 1614    Clinical Impression Statement Pt with improved balance.  Pt states he has not tried to ambulate without his cane in his home due to clutter.  He has not tried to increase the amount he can walk to improve endurance due to pt stating the there are to many hills where he lives. Therapist suggested a school track, or  parking lot but pt states he can not get there .  Pt will continut to benefit from therapy to improve balance    Personal Factors and Comorbidities Time since onset of injury/illness/exacerbation    Examination-Activity Limitations Stairs;Locomotion Level;Carry    Examination-Participation Restrictions Cleaning;Community Activity;Laundry;Meal Prep;Yard Work;Shop    Stability/Clinical Decision Making Stable/Uncomplicated    Clinical Decision Making Low    Rehab Potential Fair    PT Frequency 2x / week    PT Duration 4 weeks    PT Treatment/Interventions Patient/family education;Neuromuscular re-education;Balance training;Therapeutic exercise;Therapeutic activities;Functional mobility training;Stair training;DME Instruction;Gait training    PT Next Visit Plan Attempt retro walkng. Progress to walking without assistive device.  Continue core, postural strengthening and proximal strengthening.           Patient will benefit from skilled therapeutic intervention in order to improve the following deficits and impairments:  Abnormal gait,Decreased activity tolerance,Decreased balance,Decreased strength,Difficulty walking,Dizziness,Postural dysfunction  Visit Diagnosis: Other abnormalities of gait and mobility  Dizziness and giddiness  Muscle weakness (generalized)     Problem List Patient Active Problem List   Diagnosis Date Noted  . Close exposure to COVID-19 virus 12/13/2020  . Cough in adult 12/13/2020  . Mild episode of recurrent major depressive disorder (HCC) 08/28/2019  . Other specified anxiety disorders 05/06/2019  . Mixed obsessional thoughts and acts 05/06/2019   [name], PT CLT [phone] 03/21/2021, 4:20 PM  [facility] [address] [place], [place], [address] Phone: [phone]   Fax:  [phone]  Name: Richard Raymond MRN: [id number] Date of Birth: [birth year]

## 2021-03-22 ENCOUNTER — Encounter (HOSPITAL_COMMUNITY): Payer: Managed Care, Other (non HMO) | Admitting: Physical Therapy

## 2021-03-23 ENCOUNTER — Encounter (HOSPITAL_COMMUNITY): Payer: Managed Care, Other (non HMO)

## 2021-03-23 ENCOUNTER — Telehealth (HOSPITAL_COMMUNITY): Payer: Self-pay

## 2021-03-23 ENCOUNTER — Ambulatory Visit (HOSPITAL_COMMUNITY): Payer: Managed Care, Other (non HMO)

## 2021-03-23 NOTE — Telephone Encounter (Signed)
pt cancelled appt for today because he has a migraine

## 2021-03-27 ENCOUNTER — Encounter (HOSPITAL_COMMUNITY): Payer: Managed Care, Other (non HMO) | Admitting: Physical Therapy

## 2021-03-28 ENCOUNTER — Ambulatory Visit (HOSPITAL_COMMUNITY): Payer: Managed Care, Other (non HMO) | Attending: Family Medicine

## 2021-03-28 ENCOUNTER — Other Ambulatory Visit: Payer: Self-pay

## 2021-03-28 DIAGNOSIS — M6281 Muscle weakness (generalized): Secondary | ICD-10-CM | POA: Diagnosis present

## 2021-03-28 DIAGNOSIS — R2689 Other abnormalities of gait and mobility: Secondary | ICD-10-CM | POA: Diagnosis present

## 2021-03-28 DIAGNOSIS — R42 Dizziness and giddiness: Secondary | ICD-10-CM | POA: Diagnosis present

## 2021-03-28 NOTE — Therapy (Signed)
[facility] [address] [place], [place], [address] Phone: [phone]   Fax:  [phone]  Physical Therapy Treatment and Recertification and Progress Note  Patient Details  Name: Richard Raymond MRN: [id number] Date of Birth: 2001/09/06 Referring Provider (PT): Lilyan Punt  Progress Note Reporting Period 02/26/21 to 03/28/21  See note below for Objective Data and Assessment of Progress/Goals.       Encounter Date: 03/28/2021   PT End of Session - 03/28/21 1604    Visit Number 7    Number of Visits 8    Date for PT Re-Evaluation 04/25/21    Authorization Type [employer], recertification on 03/28/21    Progress Note Due on Visit 16    PT Start Time 1600    PT Stop Time 1645    PT Time Calculation (min) 45 min    Activity Tolerance Patient tolerated treatment well    Behavior During Therapy [facility] for tasks assessed/performed           Past Medical History:  Diagnosis Date  . Abdominal migraine   . Medical history non-contributory     Past Surgical History:  Procedure Laterality Date  . WISDOM TOOTH EXTRACTION      There were no vitals filed for this visit.   Subjective Assessment - 03/28/21 1602    Subjective States she has been trying to ambulate in the house without cane as long as she is close to furniture to steady as needed. Denies any recent falls    Limitations Walking;Standing    How long can you sit comfortably? no problem    How long can you stand comfortably? not sure    How long can you walk comfortably? fifteen minutes with his cane becomes taxing    Patient Stated Goals better balance able to walk without an assistive device    Currently in Pain? No/denies              [facility] PT Assessment - 03/28/21 0001      Assessment   Medical Diagnosis unsteady gait    Referring Provider (PT) Lilyan Punt                         [facility] Adult PT Treatment/Exercise - 03/28/21 0001      Ambulation/Gait    Ambulation/Gait Yes    Ambulation/Gait Assistance 6: Modified independent (Device/Increase time)    Ambulation Distance (Feet) 200 Feet    Assistive device Small based quad cane    Gait Pattern Step-through pattern    Stairs Yes    Stairs Assistance 6: Modified independent (Device/Increase time)    Stair Management Technique One rail Right;Alternating pattern    Number of Stairs 20      Knee/Hip Exercises: Stretches   Gastroc Stretch Both;4 reps;60 seconds      Knee/Hip Exercises: Aerobic   Nustep hills 3 level 2 x 5'      Knee/Hip Exercises: Standing   Knee Flexion Strengthening;Both;3 sets;10 reps    Knee Flexion Limitations 5    Hip Flexion Stengthening;Both;1 set   2 min stair taps with 5 lbs ankle weights   SLS with Vectors 5x 10" intermittent HHA    Other Standing Knee Exercises trunk twists 10x left/right 2kg ball   static standing feet together, wide stance, tandem stance right/left 10 sec eyes closed   Other Standing Knee Exercises sidestepping x 2 min with 5 lbs ankle weights  Knee/Hip Exercises: Seated   Sit to Sand Other (comment)   12 reps during 30 second chair rise test                 PT Education - 03/28/21 1628    Education Details education on current POC details and progress with STG/LTG    Person(s) Educated Patient    Methods Explanation    Comprehension Verbalized understanding            PT Short Term Goals - 03/28/21 1608      PT SHORT TERM GOAL #1   Title PT to be I in HEP to improve balance to be able to single leg stance on each LE for 10 seconds to decrease the risk of falling    Baseline 17 reps LLE, 30 sec RLE    Time 2    Period Weeks    Status On-going    Target Date 03/12/21      PT SHORT TERM GOAL #2   Title PT to be able to complete 20 sit to stand in a 30 second period of time to rate below average    Baseline 12 reps    Time 2    Period Weeks    Status On-going             PT Long Term Goals - 03/28/21 1612       PT LONG TERM GOAL #1   Title Pt to be able to single leg stance for 20 seconds on both LE to be able to feel confident walking without an assistive device    Baseline 17 sec LLE, 30 sec RLE    Time 4    Period Weeks    Status On-going      PT LONG TERM GOAL #2   Title Pt to be able to complete 30 sit to stand in a 30 second period to rate average for age group.    Baseline 12 reps    Time 4    Period Weeks    Status On-going      PT LONG TERM GOAL #3   Title PT to be able to go up and down 15 steps with one hand rail in order to feel more comfortable at fathers home    Baseline modified independent with reciprocal pattern and single HR x 20 steps    Time 4    Period Weeks    Status Achieved                 Plan - 03/28/21 1630    Clinical Impression Statement Progressing with POC details and able to manifest improved static balance as evidenced by single leg balance time.  Continuing to lag with 30-second chair rise test completing 12 repetitions which is well below age-matched cohort. Continued POC to assess for D/C. Demo improved independence and confidence with stair ambulation exhibiting good tolerance and stability throughout stair negotiation. Increased postural sway when eyes closed    Personal Factors and Comorbidities Time since onset of injury/illness/exacerbation    Examination-Activity Limitations Stairs;Locomotion Level;Carry    Examination-Participation Restrictions Cleaning;Community Activity;Laundry;Meal Prep;Yard Work;Shop    Stability/Clinical Decision Making Stable/Uncomplicated    Rehab Potential Fair    PT Frequency 2x / week    PT Duration 4 weeks    PT Treatment/Interventions Patient/family education;Neuromuscular re-education;Balance training;Therapeutic exercise;Therapeutic activities;Functional mobility training;Stair training;DME Instruction;Gait training    PT Next Visit Plan Attempt retro walkng. Progress to walking without assistive device.  Continue core, postural strengthening and proximal strengthening.           Patient will benefit from skilled therapeutic intervention in order to improve the following deficits and impairments:  Abnormal gait,Decreased activity tolerance,Decreased balance,Decreased strength,Difficulty walking,Dizziness,Postural dysfunction  Visit Diagnosis: Other abnormalities of gait and mobility  Dizziness and giddiness  Muscle weakness (generalized)     Problem List Patient Active Problem List   Diagnosis Date Noted  . Close exposure to COVID-19 virus 12/13/2020  . Cough in adult 12/13/2020  . Mild episode of recurrent major depressive disorder (HCC) 08/28/2019  . Other specified anxiety disorders 05/06/2019  . Mixed obsessional thoughts and acts 05/06/2019   4:46 PM, 03/28/21 M. Shary Decamp, PT, DPT Physical Therapist- [place] Office Number: [phone]  [facility] [address] [place], [place], [address] Phone: [phone]   Fax:  [phone]  Name: Deshaun Weisinger MRN: [id number] Date of Birth: 10-06-2001

## 2021-03-29 ENCOUNTER — Encounter (HOSPITAL_COMMUNITY): Payer: Managed Care, Other (non HMO) | Admitting: Physical Therapy

## 2021-03-30 ENCOUNTER — Other Ambulatory Visit: Payer: Self-pay

## 2021-03-30 ENCOUNTER — Ambulatory Visit (HOSPITAL_COMMUNITY): Payer: Managed Care, Other (non HMO) | Admitting: Physical Therapy

## 2021-03-30 ENCOUNTER — Encounter (HOSPITAL_COMMUNITY): Payer: Self-pay | Admitting: Physical Therapy

## 2021-03-30 DIAGNOSIS — R2689 Other abnormalities of gait and mobility: Secondary | ICD-10-CM | POA: Diagnosis not present

## 2021-03-30 DIAGNOSIS — M6281 Muscle weakness (generalized): Secondary | ICD-10-CM

## 2021-03-30 DIAGNOSIS — R42 Dizziness and giddiness: Secondary | ICD-10-CM

## 2021-03-30 NOTE — Therapy (Signed)
[facility] [address] [place], [place], [address] Phone: [phone]   Fax:  [phone]  Physical Therapy Treatment  Patient Details  Name: Richard Raymond MRN: [id number] Date of Birth: Apr 07, 2001 Referring Provider (PT): Lilyan Punt   Encounter Date: 03/30/2021   PT End of Session - 03/30/21 1615    Visit Number 8    Number of Visits 16    Date for PT Re-Evaluation 04/25/21    Authorization Type [employer], recertification on 03/28/21    Progress Note Due on Visit 16    PT Start Time 1615    PT Stop Time 1658    PT Time Calculation (min) 43 min    Activity Tolerance Patient tolerated treatment well    Behavior During Therapy [facility] for tasks assessed/performed           Past Medical History:  Diagnosis Date  . Abdominal migraine   . Medical history non-contributory     Past Surgical History:  Procedure Laterality Date  . WISDOM TOOTH EXTRACTION      There were no vitals filed for this visit.   Subjective Assessment - 03/30/21 1625    Subjective States that she has a minor headache 2/10 but they thought it was going to be worse as the weather and pressure has been bad today. States that she has not done her exercises since Wednesday as she has been busy.    Limitations Walking;Standing    How long can you sit comfortably? no problem    How long can you stand comfortably? not sure    How long can you walk comfortably? fifteen minutes with his cane becomes taxing    Patient Stated Goals better balance able to walk without an assistive device    Currently in Pain? Yes    Pain Score 2     Pain Location Head   headache   Pain Descriptors / Indicators Aching              [facility] PT Assessment - 03/30/21 0001      Assessment   Medical Diagnosis unsteady gait    Referring Provider (PT) Lilyan Punt                         [facility] Adult PT Treatment/Exercise - 03/30/21 0001      Knee/Hip Exercises: Standing    Other Standing Knee Exercises step up/down on dyno discs 3x5 Bilateral, tandem with feet straight on blue line x6 with SBA; backwards walking with SBA x6 15 feet.    Other Standing Knee Exercises on dyno discs with wide base of support x3 30" holds      Knee/Hip Exercises: Seated   Other Seated Knee/Hip Exercises tandem sit to stancds 2x5 Bilateral    Sit to Sand 3 sets;10 reps;without UE support   yellow ball with punch out and trunk turns                   PT Short Term Goals - 03/28/21 1608      PT SHORT TERM GOAL #1   Title PT to be I in HEP to improve balance to be able to single leg stance on each LE for 10 seconds to decrease the risk of falling    Baseline 17 reps LLE, 30 sec RLE    Time 2    Period Weeks    Status On-going    Target Date 03/12/21  PT SHORT TERM GOAL #2   Title PT to be able to complete 20 sit to stand in a 30 second period of time to rate below average    Baseline 12 reps    Time 2    Period Weeks    Status On-going             PT Long Term Goals - 03/28/21 1612      PT LONG TERM GOAL #1   Title Pt to be able to single leg stance for 20 seconds on both LE to be able to feel confident walking without an assistive device    Baseline 17 sec LLE, 30 sec RLE    Time 4    Period Weeks    Status On-going      PT LONG TERM GOAL #2   Title Pt to be able to complete 30 sit to stand in a 30 second period to rate average for age group.    Baseline 12 reps    Time 4    Period Weeks    Status On-going      PT LONG TERM GOAL #3   Title PT to be able to go up and down 15 steps with one hand rail in order to feel more comfortable at fathers home    Baseline modified independent with reciprocal pattern and single HR x 20 steps    Time 4    Period Weeks    Status Achieved                 Plan - 03/30/21 1616    Clinical Impression Statement Patient continues to ambulate with wide base of support. She was able to perform all  activities without loss of balance but notable balance challenge with stepping on and off dyno discs. Minor dizziness noted after tandem walking, allowed rest for dizziness to subside. Added backwards walking which was tolerated moderately well. Stated dizziness slightly increased at end of session compared to start of session. Added tandem sit to stand to HEP.    Personal Factors and Comorbidities Time since onset of injury/illness/exacerbation    Examination-Activity Limitations Stairs;Locomotion Level;Carry    Examination-Participation Restrictions Cleaning;Community Activity;Laundry;Meal Prep;Yard Work;Shop    Stability/Clinical Decision Making Stable/Uncomplicated    Rehab Potential Fair    PT Frequency 2x / week    PT Duration 4 weeks    PT Treatment/Interventions Patient/family education;Neuromuscular re-education;Balance training;Therapeutic exercise;Therapeutic activities;Functional mobility training;Stair training;DME Instruction;Gait training    PT Next Visit Plan Progress to walking without assistive device.  Continue core, postural strengthening and proximal strengthening.    PT Home Exercise Plan sit to stand, standing without UE support, vertical and horizontal smooth pursuits and gaze stabilizationl  4/6:  vector stance with 1 HHA;5/6 tandem sit to stand           Patient will benefit from skilled therapeutic intervention in order to improve the following deficits and impairments:  Abnormal gait,Decreased activity tolerance,Decreased balance,Decreased strength,Difficulty walking,Dizziness,Postural dysfunction  Visit Diagnosis: Other abnormalities of gait and mobility  Dizziness and giddiness  Muscle weakness (generalized)     Problem List Patient Active Problem List   Diagnosis Date Noted  . Close exposure to COVID-19 virus 12/13/2020  . Cough in adult 12/13/2020  . Mild episode of recurrent major depressive disorder (HCC) 08/28/2019  . Other specified anxiety  disorders 05/06/2019  . Mixed obsessional thoughts and acts 05/06/2019   4:59 PM, 03/30/21 Tereasa Coop, DPT Physical Therapy with Tomasa Hosteller  [facility]  [phone] office  [facility] [address] [place], [place], [address] Phone: [phone]   Fax:  [phone]  Name: Richard Raymond MRN: [id number] Date of Birth: [birth year]

## 2021-04-04 ENCOUNTER — Encounter (HOSPITAL_COMMUNITY): Payer: Managed Care, Other (non HMO)

## 2021-04-06 ENCOUNTER — Other Ambulatory Visit: Payer: Self-pay

## 2021-04-06 ENCOUNTER — Ambulatory Visit (HOSPITAL_COMMUNITY): Payer: Managed Care, Other (non HMO)

## 2021-04-06 ENCOUNTER — Encounter (HOSPITAL_COMMUNITY): Payer: Self-pay

## 2021-04-06 DIAGNOSIS — R42 Dizziness and giddiness: Secondary | ICD-10-CM

## 2021-04-06 DIAGNOSIS — R2689 Other abnormalities of gait and mobility: Secondary | ICD-10-CM | POA: Diagnosis not present

## 2021-04-06 DIAGNOSIS — M6281 Muscle weakness (generalized): Secondary | ICD-10-CM

## 2021-04-06 NOTE — Therapy (Signed)
[facility] [address] [place], [place], [address] Phone: [phone]   Fax:  [phone]  Physical Therapy Treatment  Patient Details  Name: Richard Raymond MRN: [id number] Date of Birth: February 14, 2001 Referring Provider (PT): Lilyan Punt   Encounter Date: 04/06/2021   PT End of Session - 04/06/21 1441    Visit Number 9    Number of Visits 16    Date for PT Re-Evaluation 04/25/21    Authorization Type [employer], recertification on 03/28/21    Progress Note Due on Visit 16    PT Start Time 1430    PT Stop Time 1515    PT Time Calculation (min) 45 min    Activity Tolerance Patient tolerated treatment well    Behavior During Therapy [facility] for tasks assessed/performed           Past Medical History:  Diagnosis Date  . Abdominal migraine   . Medical history non-contributory     Past Surgical History:  Procedure Laterality Date  . WISDOM TOOTH EXTRACTION      There were no vitals filed for this visit.   Subjective Assessment - 04/06/21 1440    Subjective Reports she has been busy and not sleeping well at night.  Reports near fall incident but was able to recover. Reports dizziness is constant despite position but notes increased symptoms when bending over    Limitations Walking;Standing    How long can you sit comfortably? no problem    How long can you stand comfortably? not sure    How long can you walk comfortably? fifteen minutes with his cane becomes taxing    Patient Stated Goals better balance able to walk without an assistive device                             [facility] Adult PT Treatment/Exercise - 04/06/21 0001      Knee/Hip Exercises: Aerobic   Nustep NUstep level 5 x 2 min warm-up, 30 sec fast/30 sec slow for 3 trials, 2 min cool down      Knee/Hip Exercises: Machines for Strengthening   Total Gym Leg Press 1x10 bilat, 1x10 unilat left/right      Knee/Hip Exercises: Standing   Lunge Walking - Round Trips  tall  kneeling with PNF chop 2 kg med ball    Rebounder LE elevated on 4" step and throwing 2 kg med ball 20x left/right    Gait Training retro-walking 4x20 ft    Other Standing Knee Exercises tandem stance with one foot on airex, one on dynadisc 3x15 sec left/right    Other Standing Knee Exercises single leg side, back, forward lunge 5x right/left                    PT Short Term Goals - 03/28/21 1608      PT SHORT TERM GOAL #1   Title PT to be I in HEP to improve balance to be able to single leg stance on each LE for 10 seconds to decrease the risk of falling    Baseline 17 reps LLE, 30 sec RLE    Time 2    Period Weeks    Status On-going    Target Date 03/12/21      PT SHORT TERM GOAL #2   Title PT to be able to complete 20 sit to stand in a 30 second period of time to rate below average  Baseline 12 reps    Time 2    Period Weeks    Status On-going             PT Long Term Goals - 03/28/21 1612      PT LONG TERM GOAL #1   Title Pt to be able to single leg stance for 20 seconds on both LE to be able to feel confident walking without an assistive device    Baseline 17 sec LLE, 30 sec RLE    Time 4    Period Weeks    Status On-going      PT LONG TERM GOAL #2   Title Pt to be able to complete 30 sit to stand in a 30 second period to rate average for age group.    Baseline 12 reps    Time 4    Period Weeks    Status On-going      PT LONG TERM GOAL #3   Title PT to be able to go up and down 15 steps with one hand rail in order to feel more comfortable at fathers home    Baseline modified independent with reciprocal pattern and single HR x 20 steps    Time 4    Period Weeks    Status Achieved                 Plan - 04/06/21 1518    Clinical Impression Statement Continues to ambulate with wide BOS and unsteadiness on feet evident and increase in LOB when LLE is challenged or placed in narrow BOS. Some decrease in left ankle DF noted in swing  phase and maintaining LUE in slight guard position when ambulating.  Continued POC indicated to improve dynamic balance and reduce risk for falls    Personal Factors and Comorbidities Time since onset of injury/illness/exacerbation    Examination-Activity Limitations Stairs;Locomotion Level;Carry    Examination-Participation Restrictions Cleaning;Community Activity;Laundry;Meal Prep;Yard Work;Shop    Stability/Clinical Decision Making Stable/Uncomplicated    Rehab Potential Fair    PT Frequency 2x / week    PT Duration 4 weeks    PT Treatment/Interventions Patient/family education;Neuromuscular re-education;Balance training;Therapeutic exercise;Therapeutic activities;Functional mobility training;Stair training;DME Instruction;Gait training    PT Next Visit Plan Progress to walking without assistive device.  Continue core, postural strengthening and proximal strengthening.    PT Home Exercise Plan sit to stand, standing without UE support, vertical and horizontal smooth pursuits and gaze stabilizationl  4/6:  vector stance with 1 HHA;5/6 tandem sit to stand           Patient will benefit from skilled therapeutic intervention in order to improve the following deficits and impairments:  Abnormal gait,Decreased activity tolerance,Decreased balance,Decreased strength,Difficulty walking,Dizziness,Postural dysfunction  Visit Diagnosis: Other abnormalities of gait and mobility  Dizziness and giddiness  Muscle weakness (generalized)     Problem List Patient Active Problem List   Diagnosis Date Noted  . Close exposure to COVID-19 virus 12/13/2020  . Cough in adult 12/13/2020  . Mild episode of recurrent major depressive disorder (HCC) 08/28/2019  . Other specified anxiety disorders 05/06/2019  . Mixed obsessional thoughts and acts 05/06/2019   3:21 PM, 04/06/21 M. Shary Decamp, PT, DPT Physical Therapist- [place] Office Number: [phone]  [facility] [address] [place], [place], [address] Phone: [phone]   Fax:  [phone]  Name: Richard Raymond MRN: [id number] Date of Birth: 2001/09/28

## 2021-04-12 ENCOUNTER — Other Ambulatory Visit: Payer: Self-pay

## 2021-04-12 ENCOUNTER — Telehealth (INDEPENDENT_AMBULATORY_CARE_PROVIDER_SITE_OTHER): Payer: 59 | Admitting: Child and Adolescent Psychiatry

## 2021-04-12 DIAGNOSIS — F418 Other specified anxiety disorders: Secondary | ICD-10-CM

## 2021-04-12 DIAGNOSIS — F330 Major depressive disorder, recurrent, mild: Secondary | ICD-10-CM

## 2021-04-12 DIAGNOSIS — F422 Mixed obsessional thoughts and acts: Secondary | ICD-10-CM | POA: Diagnosis not present

## 2021-04-12 NOTE — Progress Notes (Signed)
Virtual Visit via Video Note  I connected with Richard Raymond on 04/12/21 at  2:30 PM EDT by a video enabled telemedicine application and verified that I am speaking with the correct person using two identifiers.  Location: Patient: home Provider: office   I discussed the limitations of evaluation and management by telemedicine and the availability of in person appointments. The patient expressed understanding and agreed to proceed.  I discussed the assessment and treatment plan with the patient. The patient was provided an opportunity to ask questions and all were answered. The patient agreed with the plan and demonstrated an understanding of the instructions.   The patient was advised to call back or seek an in-person evaluation if the symptoms worsen or if the condition fails to improve as anticipated.  I provided 30 minutes of non-face-to-face time during this encounter.   [name], MD        [facility] MD/PA/NP OP Progress Note 04/12/21 2:30 pm Richard Raymond  MRN:  [id number]  Synopsis: This is a 20 year old Caucasian, assigned male at birth, currently identifying his transgender male preferring pronouns she/her and name "Richard Raymond".    Chief Complaint: Medication management follow-up.  HPI:  Richard Raymond was seen and evaluated over telemedicine encounter for medication management follow-up.  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 She is recommended repeat MRI for further assessment. She also saw endocrinologist for gender affirming treatment for gender dysphoria and recommended low dose estrogen treatment. She is currently not taking it since she is not sure if her insurance would cover it. She is planning to check with her insurance.   In regards of her dizziness, she reports that she continues to struggle with that, physical therapy has helped some and she is now able to walk without the cane.  In  regards of her mood she reports that her mood has been depressed on most days.  She denies anhedonia and reports that she enjoys reading.  She reports that she has been spending time fixing the computer so that she can start playing video games with her friends.  She expressed a great deal of frustration around computer not working for her.  She reports that she has been spending about 3 to 8 hours every day since last about 1 week to fix her computer by installing windows 7.  She reports that because of this she has been forgetting to take medication since at least about last 10 to 11 days.  She reports that she over things at night and that keeps her up at night and usually goes to bed around 2 or 3 AM.  She denies problems with appetite, reports poor energy, denies any suicidal thoughts or homicidal thoughts.  In regards of anxiety she reports that she has not been feeling anxious lately.  She identifies that the major stressor at this time is her dizziness and computer not working for her.  We discussed strategies to improve her medication adherence, recommended to restart Prozac at 40 mg once a day since she has tolerated it well before.  She was receptive and agreed to resume medication and remain compliant.  We discussed her follow-up in 4 to 6 weeks or earlier if needed.  Visit Diagnosis:    ICD-10-CM   1. Mild episode of recurrent major depressive disorder (HCC)  F33.0   2. Mixed obsessional thoughts and acts  F42.2   3. Other specified anxiety  disorders  F41.8     Past Psychiatric History:reviewed today no change from last visit, psychiatric hospitalization at [facility] for a week at the beginning of August. Discontinued Prozac 60 mg daily by self in 01/2020, back on 20 mg daily from 02/2020 but only partially compliant.  She also started seeing Mr. Bynum Bellows for ind therapy but non compliant.   Past Medical History:  Past Medical History:  Diagnosis Date  . Abdominal migraine   . Medical  history non-contributory     Past Surgical History:  Procedure Laterality Date  . WISDOM TOOTH EXTRACTION      Family Psychiatric History: reviewed today, no change    Family History:  Family History  Problem Relation Age of Onset  . ADD / ADHD Brother     Social History:  Social History   Socioeconomic History  . Marital status: Single    Spouse name: Not on file  . Number of children: 0  . Years of education: Not on file  . Highest education level: 12th grade  Occupational History  . Not on file  Tobacco Use  . Smoking status: Never Smoker  . Smokeless tobacco: Never Used  Vaping Use  . Vaping Use: Never used  Substance and Sexual Activity  . Alcohol use: No  . Drug use: No  . Sexual activity: Not Currently  Other Topics Concern  . Not on file  Social History Narrative  . Not on file   Social Determinants of Health   Financial Resource Strain: Not on file  Food Insecurity: Not on file  Transportation Needs: Not on file  Physical Activity: Not on file  Stress: Not on file  Social Connections: Not on file    Allergies:  Allergies  Allergen Reactions  . Cefzil [Cefprozil]     Diarrhea    Metabolic Disorder Labs: Lab Results  Component Value Date   HGBA1C 5.3 06/28/2019   MPG 105.41 06/28/2019   Lab Results  Component Value Date   PROLACTIN 9.2 01/23/2021   PROLACTIN 17.5 (H) 06/28/2019   Lab Results  Component Value Date   CHOL 197 (H) 06/28/2019   TRIG 87 06/28/2019   HDL 29 (L) 06/28/2019   CHOLHDL 6.8 06/28/2019   VLDL 17 06/28/2019   LDLCALC 151 (H) 06/28/2019   Lab Results  Component Value Date   TSH 1.930 06/22/2019    Therapeutic Level Labs: No results found for: LITHIUM No results found for: VALPROATE No components found for:  CBMZ  Current Medications: Current Outpatient Medications  Medication Sig Dispense Refill  . albuterol (VENTOLIN HFA) 108 (90 Base) MCG/ACT inhaler Inhale 2 puffs into the lungs every 6 (six) hours  as needed for wheezing or shortness of breath. 8 g 0  . FLUoxetine (PROZAC) 10 MG capsule TAKE 3 CAPSULES BY MOUTH DAILY. 270 capsule 1  . hydrOXYzine (ATARAX/VISTARIL) 25 MG tablet Take 1 tablet (25 mg total) by mouth at bedtime as needed for anxiety (sleep). 90 tablet 0  . MECLIZINE HCL PO Take by mouth.     No current facility-administered medications for this visit.     Musculoskeletal: Strength & Muscle Tone:unable to assess since visit was over the telemedicine. Gait & Station:unable to assess since visit was over the telemedicine. Patient leans: N/A  Psychiatric Specialty Exam: ROSReview of 12 systems negative except as mentioned in HPI   Mental Status Exam: Appearance: casually dressed; long hair; no overt signs of trauma or distress noted Attitude: calm, cooperative with  good eye contact Activity: No PMA/PMR, no tics/no tremors; no EPS noted  Speech: Dysprosody Thought Process:  Linear, concrete and goal-directed.  Associations: no looseness, tangentiality, circumstantiality, flight of ideas, thought blocking or word salad noted Thought Content: (abnormal/psychotic thoughts): no abnormal or delusional thought process evidenced SI/HI: denies Si/Hi Perception: no illusions or visual/auditory hallucinations noted; no response to internal stimuli demonstrated Mood & Affect: "ok"/full range Judgment & Insight: both fair Attention and Concentration : Good Cognition : WNL Language : Good ADL - Intact       Assessment and Plan:   # Depression (recurrent, mild to moderate) - Recommended restarting Prozac 40 mg daily, psychoeducation on med adherence provided as mentioned above.   - He did not want to restart therapy but agrees to reconsider after he is physically well.     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  - Same as mentioned above.   #  OCD (improved)  # Asperger's syndrome/ASD (chronic) - Mother's report of some sensory issues, difficulties with transition when he was young, concreteness, concerning for Asperger's syndrome. -  He is undergoing eval for ASD.   # Gender dysphoria - at present would like to use she/her pronoun and name - Richard Raymond - parents are aware and appears supportive - Saw endocrine in 05/22 and recommended low dose estrogen.    This note was generated in part or whole with voice recognition software. Voice recognition is usually quite accurate but there are transcription errors that can and very often do occur. I apologize for any typographical errors that were not detected and corrected.  30 minutes total time for encounter today which included chart review, pt evaluation, collaterals, medication and other treatment discussions, medication orders and charting.        [name], MD   04/12/21  2:30 pm

## 2021-05-09 ENCOUNTER — Encounter (HOSPITAL_COMMUNITY): Payer: Self-pay | Admitting: Physical Therapy

## 2021-05-09 NOTE — Therapy (Signed)
[place], [place], [address] Phone: [phone]   Fax:  [phone]  Patient Details  Name: Richard Raymond MRN: [id number] Date of Birth: January 10, 2001 Referring Provider:  No ref. provider found  Encounter Date: 05/09/2021 PHYSICAL THERAPY DISCHARGE SUMMARY  Visits from Start of Care:9  Current functional level related to goals / functional outcomes: No change    Remaining deficits: Dizzy 24/7   Education / Equipment: HEP for habituation    Patient agrees to discharge. Patient goals were not met. Patient is being discharged due to not returning since the last visit. [place], [place] [phone] 05/09/2021, 4:07 PM  [place] [address] [place], [place], [address] Phone: [phone]   Fax:  [phone]

## 2021-05-16 ENCOUNTER — Telehealth (INDEPENDENT_AMBULATORY_CARE_PROVIDER_SITE_OTHER): Payer: Managed Care, Other (non HMO) | Admitting: Family Medicine

## 2021-05-16 ENCOUNTER — Other Ambulatory Visit: Payer: Self-pay

## 2021-05-16 DIAGNOSIS — F418 Other specified anxiety disorders: Secondary | ICD-10-CM

## 2021-05-16 NOTE — Progress Notes (Signed)
   Subjective:    Patient ID: Richard Raymond, adult    DOB: Jan 25, 2001, 20 y.o.   MRN: [id number]  HPI Med check up on anxiety. Phq9 and gad 7 done.   Depression screen [facility] 2/9 05/16/2021  Decreased Interest 0  Down, Depressed, Hopeless 0  PHQ - 2 Score 0  Altered sleeping 1  Tired, decreased energy 1  Change in appetite 1  Feeling bad or failure about yourself  1  Trouble concentrating 3  Moving slowly or fidgety/restless 0  Suicidal thoughts 0  PHQ-9 Score 7  Difficult doing work/chores Not difficult at all  Some encounter information is confidential and restricted. Go to Review Flowsheets activity to see all data.    Flowsheet Row Video Visit from 05/16/2021 in [facility]  PHQ-9 Total Score 7      GAD 7 : Generalized Anxiety Score 05/16/2021  Nervous, Anxious, on Edge 2  Control/stop worrying 1  Worry too much - different things 1  Trouble relaxing 1  Restless 0  Easily annoyed or irritable 1  Afraid - awful might happen 0  Total GAD 7 Score 6  Anxiety Difficulty Not difficult at all    Patient under the care of psychiatry and counseling Taking medication seems to be helping some Trying to gain weight through better eating Not suicidal  Virtual Visit via Telephone Note  I connected with Kullen Augusta on 05/16/21 at  1:10 PM EDT by telephone and verified that I am speaking with the correct person using two identifiers.  Location: Patient: home  Provider: office   I discussed the limitations, risks, security and privacy concerns of performing an evaluation and management service by telephone and the availability of in person appointments. I also discussed with the patient that there may be a patient responsible charge related to this service. The patient expressed understanding and agreed to proceed.   History of Present Illness:    Observations/Objective:   Assessment and Plan:   Follow Up Instructions:    I discussed the assessment and  treatment plan with the patient. The patient was provided an opportunity to ask questions and all were answered. The patient agreed with the plan and demonstrated an understanding of the instructions.   The patient was advised to call back or seek an in-person evaluation if the symptoms worsen or if the condition fails to improve as anticipated. 15 I provided 15 minutes of non-face-to-face time during this encounter.       Review of Systems     Objective:   Physical Exam Today's visit was via telephone Physical exam was not possible for this visit        Assessment & Plan:  Moderate depression under the care of behavioral health continue taking medication  Moderate anxiety encourage patient to do some counseling they defer currently continue current medication  Dizziness recent MRI and blood work looked good which is reassuring follow-up with neurologist  Recheck here 6 months

## 2021-05-30 ENCOUNTER — Other Ambulatory Visit: Payer: Self-pay

## 2021-05-30 ENCOUNTER — Telehealth (INDEPENDENT_AMBULATORY_CARE_PROVIDER_SITE_OTHER): Payer: 59 | Admitting: Child and Adolescent Psychiatry

## 2021-05-30 DIAGNOSIS — F422 Mixed obsessional thoughts and acts: Secondary | ICD-10-CM

## 2021-05-30 DIAGNOSIS — F330 Major depressive disorder, recurrent, mild: Secondary | ICD-10-CM | POA: Diagnosis not present

## 2021-05-30 DIAGNOSIS — F418 Other specified anxiety disorders: Secondary | ICD-10-CM

## 2021-05-30 NOTE — Progress Notes (Signed)
Virtual Visit via Telephone Note  I connected with [name] on 05/30/21 at  3:00 PM EDT by telephone and verified that I am speaking with the correct person using two identifiers.  Location: Patient: home Provider: office   I discussed the limitations, risks, security and privacy concerns of performing an evaluation and management service by telephone and the availability of in person appointments. I also discussed with the patient that there may be a patient responsible charge related to this service. The patient expressed understanding and agreed to proceed.    I discussed the assessment and treatment plan with the patient. The patient was provided an opportunity to ask questions and all were answered. The patient agreed with the plan and demonstrated an understanding of the instructions.   The patient was advised to call back or seek an in-person evaluation if the symptoms worsen or if the condition fails to improve as anticipated.  I provided 25 minutes of non-face-to-face time during this encounter.   [name], MD        [facility] MD/PA/NP OP Progress Note 05/30/21 2:30 pm Christopherjohn Uselman  MRN:  [id number]  Synopsis: This is a 20 year old Caucasian, assigned male at birth, currently identifying his transgender male preferring pronouns she/her and name "Richard Raymond".    Chief Complaint: Medication management follow-up.  HPI:  [name] was seen and evaluated over telephone encounter for medication management follow-up.  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   [name] was scheduled for telemedicine appointment however requested this appointment to be over telephone and instead of video appointment. She reports that Elavil has been helping him with dizziness and she has noticed improvement.  She reports that she believes that she needs to increase the dose and has an appointment with  neurologist tomorrow.  In regards of mood she reports that her mood is "tired".  She reports that she feels mentally and physically exhausted because of not being able to do anything due to dizziness.  She does report that she has been playing World of [employer] which she has been enjoying for the most part.  She reports that she has been eating well, sleep has been better since she started taking amitriptyline however forgot to take it for the last 2 days and therefore was up very late in the morning.  She reports that she is also been more consistently taking her Prozac.  She reports that she has been taking Prozac 30 mg and old 40 mg because she never increased the dose to 40 mg as discussed during the previous appointments.  In regards of anxiety she reports that her anxiety is around 4 out of 10 (10 = most anxious).  She denies any suicidal thoughts or homicidal thoughts.  She reports that she has been planning to go back to college however it depends on how she does with her dizziness.  Given overall stability in her symptoms and being on amitriptyline I discussed to continue with fluoxetine 30 mg once a day and follow back again in 2 months or earlier if needed.  She verbalized understanding and agreed with the plan.   Visit Diagnosis:    ICD-10-CM   1. Mild episode of recurrent major depressive disorder (HCC)  F33.0     2. Mixed obsessional thoughts and acts  F42.2     3. Other specified anxiety disorders  F41.8  Past Psychiatric History:reviewed today no change from last visit, psychiatric hospitalization at [facility] for a week at the beginning of August. Discontinued Prozac 60 mg daily by self in 01/2020, back on 20 mg daily from 02/2020 but only partially compliant.  She also started seeing Mr. Bynum Bellows for ind therapy but non compliant.   Past Medical History:  Past Medical History:  Diagnosis Date   Abdominal migraine    Medical history non-contributory     Past Surgical  History:  Procedure Laterality Date   WISDOM TOOTH EXTRACTION      Family Psychiatric History: reviewed today, no change    Family History:  Family History  Problem Relation Age of Onset   ADD / ADHD Brother     Social History:  Social History   Socioeconomic History   Marital status: Single    Spouse name: Not on file   Number of children: 0   Years of education: Not on file   Highest education level: 12th grade  Occupational History   Not on file  Tobacco Use   Smoking status: Never   Smokeless tobacco: Never  Vaping Use   Vaping Use: Never used  Substance and Sexual Activity   Alcohol use: No   Drug use: No   Sexual activity: Not Currently  Other Topics Concern   Not on file  Social History Narrative   Not on file   Social Determinants of Health   Financial Resource Strain: Not on file  Food Insecurity: Not on file  Transportation Needs: Not on file  Physical Activity: Not on file  Stress: Not on file  Social Connections: Not on file    Allergies:  Allergies  Allergen Reactions   Cefzil [Cefprozil]     Diarrhea    Metabolic Disorder Labs: Lab Results  Component Value Date   HGBA1C 5.3 06/28/2019   MPG 105.41 06/28/2019   Lab Results  Component Value Date   PROLACTIN 9.2 01/23/2021   PROLACTIN 17.5 (H) 06/28/2019   Lab Results  Component Value Date   CHOL 197 (H) 06/28/2019   TRIG 87 06/28/2019   HDL 29 (L) 06/28/2019   CHOLHDL 6.8 06/28/2019   VLDL 17 06/28/2019   LDLCALC 151 (H) 06/28/2019   Lab Results  Component Value Date   TSH 1.930 06/22/2019    Therapeutic Level Labs: No results found for: LITHIUM No results found for: VALPROATE No components found for:  CBMZ  Current Medications: Current Outpatient Medications  Medication Sig Dispense Refill   albuterol (VENTOLIN HFA) 108 (90 Base) MCG/ACT inhaler Inhale 2 puffs into the lungs every 6 (six) hours as needed for wheezing or shortness of breath. 8 g 0   FLUoxetine  (PROZAC) 10 MG capsule TAKE 3 CAPSULES BY MOUTH DAILY. 270 capsule 1   hydrOXYzine (ATARAX/VISTARIL) 25 MG tablet Take 1 tablet (25 mg total) by mouth at bedtime as needed for anxiety (sleep). 90 tablet 0   MECLIZINE HCL PO Take by mouth.     No current facility-administered medications for this visit.     Musculoskeletal: Strength & Muscle Tone:unable to assess since visit was over the telemedicine. Gait & Station:unable to assess since visit was over the telemedicine. Patient leans: N/A  Psychiatric Specialty Exam: ROSReview of 12 systems negative except as mentioned in HPI   Mental Status Exam: Appearance: unable to assess since virtual visit was over the telephone Attitude: calm, cooperative  Activity: unable to assess since virtual visit was over the telephone  Speech: normal rate, rhythm and volume Thought Process: Logical, linear, and goal-directed.  Associations: no looseness, tangentiality, circumstantiality, flight of ideas, thought blocking or word salad noted Thought Content: (abnormal/psychotic thoughts): no abnormal or delusional thought process evidenced SI/HI: denies Si/Hi Perception: no illusions or visual/auditory hallucinations noted; Mood & Affect: "good"/unable to assess since virtual visit was over the telephone  Judgment & Insight: both fair Attention and Concentration : Good Cognition : WNL Language : Good ADL - Intact       Assessment and Plan:   # Depression (recurrent, mild to moderate) - Recommended continuing Prozac 30 mg daily, psychoeducation on med adherence provided as mentioned above.   - She did not want to restart therapy but agrees to reconsider after she is physically well.     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  - Same as mentioned above.    # OCD (improved)   # Asperger's syndrome/ASD (chronic) -  Mother's report of some sensory issues, difficulties with transition when he was young, concreteness, concerning for Asperger's syndrome. -  She completed evaluation for ASD and reports that she is not diagnosed with ASD.   # Gender dysphoria - at present would like to use she/her pronoun and name - Richard Raymond - parents are aware and appears supportive - Saw endocrine in 05/22 and recommended low dose estrogen.    This note was generated in part or whole with voice recognition software. Voice recognition is usually quite accurate but there are transcription errors that can and very often do occur. I apologize for any typographical errors that were not detected and corrected.  30 minutes total time for encounter today which included chart review, pt evaluation, collaterals, medication and other treatment discussions, medication orders and charting.        [name], MD   05/30/21  2:30 pm

## 2021-07-24 ENCOUNTER — Encounter: Payer: Self-pay | Admitting: Child and Adolescent Psychiatry

## 2021-07-24 ENCOUNTER — Telehealth (INDEPENDENT_AMBULATORY_CARE_PROVIDER_SITE_OTHER): Payer: 59 | Admitting: Child and Adolescent Psychiatry

## 2021-07-24 DIAGNOSIS — F3341 Major depressive disorder, recurrent, in partial remission: Secondary | ICD-10-CM

## 2021-07-24 DIAGNOSIS — F422 Mixed obsessional thoughts and acts: Secondary | ICD-10-CM

## 2021-07-24 DIAGNOSIS — F418 Other specified anxiety disorders: Secondary | ICD-10-CM

## 2021-07-24 NOTE — Progress Notes (Signed)
Virtual Visit via Video Note  I connected with Richard Raymond on 07/24/21 at  2:30 PM EDT by a video enabled telemedicine application and verified that I am speaking with the correct person using two identifiers.  Location: Patient: home Provider: office   I discussed the limitations of evaluation and management by telemedicine and the availability of in person appointments. The patient expressed understanding and agreed to proceed.    I discussed the assessment and treatment plan with the patient. The patient was provided an opportunity to ask questions and all were answered. The patient agreed with the plan and demonstrated an understanding of the instructions.   The patient was advised to call back or seek an in-person evaluation if the symptoms worsen or if the condition fails to improve as anticipated.  I provided 30 minutes of non-face-to-face time during this encounter.   [name], MD         [facility] MD/PA/NP OP Progress Note 07/24/21 2:30 pm Asahd Kocurek  MRN:  [id number]  Synopsis: This is a 20 year old Caucasian, assigned male at birth, currently identifying his transgender male preferring pronouns she/her and name "Richard Raymond".    Chief Complaint: Medication management follow-up. HPI:  [name] was seen and evaluated over telemedicine encounter for medication management follow-up.   In the interim since last appointment he recently had an appointment with his neurologist at [facility] for migraine and dizziness and was recommended to start Effexor XR 37.5 mg.  Based on the note reviewed from his neurologist his neurologist was aware that patient is currently taking Prozac 30 mg once a day and educated her about risk of serotonin syndrome.  Emilee reports that she has not yet started taking Effexor but soon planning to do so.  She reports that her neurologist thinks that would help with her dizziness and giddiness.  I also discussed with her regarding risk of serotonin syndrome  with concurrent use of Effexor and Prozac.  She verbalized understanding.  She was advised to go to emergency room for any signs or symptoms of serotonin syndrome.  Psychoeducation was provided on signs and symptoms of serotonin syndrome.  Richard Raymond reports that she is doing well.  She reports that she has not been sleeping well because she is working on her World of [employer] and therefore feels exhausted.  She however reports that her mood has been "neutral", her anxiety has been minimal, and OCD has been manageable, eating well, denies any SI/HI, denies any new psychosocial stressors.  She reports that she enjoys her World of [employer] and also has recently started to learn cooking which she enjoys.   I discussed with her that given stability in her symptoms recommend to continue with Prozac 30 mg once a day.  She verbalized understanding and agreed with the plan.  She was also recommended to provide psychological evaluation report for her autism testing.  She will email.  She is recommended to follow back in 2 months or earlier if needed.  Visit Diagnosis:    ICD-10-CM   1. Mixed obsessional thoughts and acts  F42.2     2. Other specified anxiety disorders  F41.8     3. Recurrent major depressive disorder, in partial remission (HCC)  F33.41       Past Psychiatric History:reviewed today no change from last visit, psychiatric hospitalization at [facility] for a week at the beginning of August. Discontinued Prozac 60 mg daily by self in 01/2020, back on 20 mg daily from 02/2020 but  only partially compliant.  She also started seeing Mr. Bynum Bellows for ind therapy but non compliant.   Past Medical History:  Past Medical History:  Diagnosis Date   Abdominal migraine    Medical history non-contributory     Past Surgical History:  Procedure Laterality Date   WISDOM TOOTH EXTRACTION      Family Psychiatric History: reviewed today, no change    Family History:  Family History   Problem Relation Age of Onset   ADD / ADHD Brother     Social History:  Social History   Socioeconomic History   Marital status: Single    Spouse name: Not on file   Number of children: 0   Years of education: Not on file   Highest education level: 12th grade  Occupational History   Not on file  Tobacco Use   Smoking status: Never   Smokeless tobacco: Never  Vaping Use   Vaping Use: Never used  Substance and Sexual Activity   Alcohol use: No   Drug use: No   Sexual activity: Not Currently  Other Topics Concern   Not on file  Social History Narrative   Not on file   Social Determinants of Health   Financial Resource Strain: Not on file  Food Insecurity: Not on file  Transportation Needs: Not on file  Physical Activity: Not on file  Stress: Not on file  Social Connections: Not on file    Allergies:  Allergies  Allergen Reactions   Cefzil [Cefprozil]     Diarrhea    Metabolic Disorder Labs: Lab Results  Component Value Date   HGBA1C 5.3 06/28/2019   MPG 105.41 06/28/2019   Lab Results  Component Value Date   PROLACTIN 9.2 01/23/2021   PROLACTIN 17.5 (H) 06/28/2019   Lab Results  Component Value Date   CHOL 197 (H) 06/28/2019   TRIG 87 06/28/2019   HDL 29 (L) 06/28/2019   CHOLHDL 6.8 06/28/2019   VLDL 17 06/28/2019   LDLCALC 151 (H) 06/28/2019   Lab Results  Component Value Date   TSH 1.930 06/22/2019    Therapeutic Level Labs: No results found for: LITHIUM No results found for: VALPROATE No components found for:  CBMZ  Current Medications: Current Outpatient Medications  Medication Sig Dispense Refill   albuterol (VENTOLIN HFA) 108 (90 Base) MCG/ACT inhaler Inhale 2 puffs into the lungs every 6 (six) hours as needed for wheezing or shortness of breath. 8 g 0   FLUoxetine (PROZAC) 10 MG capsule TAKE 3 CAPSULES BY MOUTH DAILY. 270 capsule 1   hydrOXYzine (ATARAX/VISTARIL) 25 MG tablet Take 1 tablet (25 mg total) by mouth at bedtime as  needed for anxiety (sleep). 90 tablet 0   MECLIZINE HCL PO Take by mouth.     No current facility-administered medications for this visit.     Musculoskeletal: Strength & Muscle Tone:unable to assess since visit was over the telemedicine. Gait & Station:unable to assess since visit was over the telemedicine. Patient leans: N/A  Psychiatric Specialty Exam: ROSReview of 12 systems negative except as mentioned in HPI   Mental Status Exam: Appearance: casually dressed; fairly groomed; no overt signs of trauma or distress noted Attitude: calm, cooperative with good eye contact Activity: No PMA/PMR, no tics/no tremors; no EPS noted  Speech: normal rate, rhythm and volume Thought Process: Logical, linear, and goal-directed.  Associations: no looseness, tangentiality, circumstantiality, flight of ideas, thought blocking or word salad noted Thought Content: (abnormal/psychotic thoughts): no abnormal or delusional  thought process evidenced SI/HI: denies Si/Hi Perception: no illusions or visual/auditory hallucinations noted; no response to internal stimuli demonstrated Mood & Affect: "neutral"/full range Judgment & Insight: both fair Attention and Concentration : Good Cognition : WNL Language : Good ADL - Intact       Assessment and Plan:   # Depression (recurrent, in remission) - Recommended continuing Prozac 30 mg daily, psychoeducation on med adherence provided as mentioned above.   - She did not want to restart therapy but agrees to reconsider after she is physically well.     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  - Same as mentioned above.    # OCD (improved)   # Asperger's syndrome/ASD (chronic) - Mother's report of some sensory issues, difficulties with transition when he was young, concreteness, concerning for Asperger's syndrome. -  She completed  evaluation for ASD and reports that she is not diagnosed with ASD. She is asked to send the report.   # Gender dysphoria - at present would like to use she/her pronoun and name - Richard Raymond - parents are aware and appears supportive - Saw endocrine in 05/22 and recommended low dose estrogen.    This note was generated in part or whole with voice recognition software. Voice recognition is usually quite accurate but there are transcription errors that can and very often do occur. I apologize for any typographical errors that were not detected and corrected.  30 minutes total time for encounter today which included chart review, pt evaluation, collaterals, medication and other treatment discussions, medication orders and charting.        [name], MD   07/24/21  2:30 pm

## 2022-04-02 IMAGING — MR MR HEAD W/O CM
14 of 15 series · 43 of 48 positions shown · non-contrast
Comparison: None.

CLINICAL DATA: Vertigo, central dizziness.

EXAM:
MRI HEAD WITHOUT CONTRAST
TECHNIQUE: Multiplanar, multiecho pulse sequences of the brain and surrounding
structures were obtained without intravenous contrast.

[Series 5: DWI · axial · 4.0mm · 0.88mm/px · z∈[-85,+55]mm · 4 of 36 slices shown (1 of 6)]
[im 1/36]
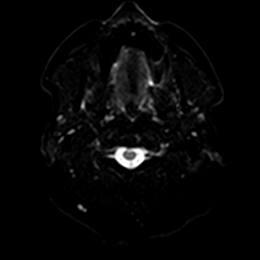
[im 12/36]
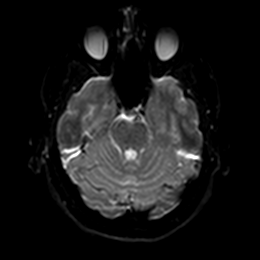
[im 24/36]
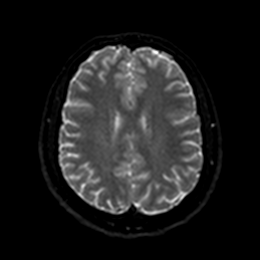
[im 36/36]
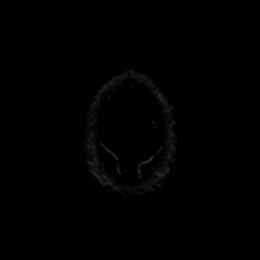

[Series 5: DWI · axial · 4.0mm · 0.88mm/px · z∈[-85,+55]mm · 4 of 36 slices shown (2 of 6)]
[im 1/36]
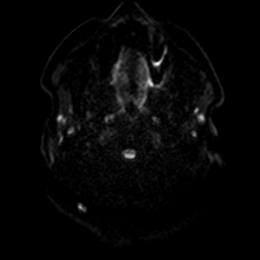
[im 12/36]
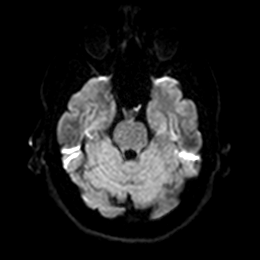
[im 24/36]
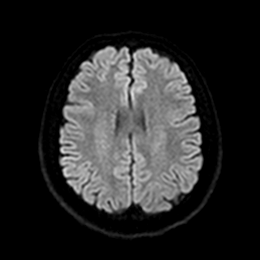
[im 36/36]
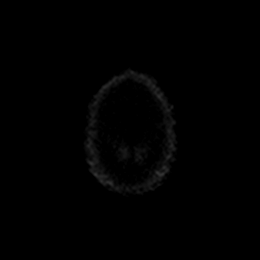

[Series 6: DWI · axial · 4.0mm · 0.88mm/px · z∈[-85,+55]mm · 3 of 36 slices shown (3 of 6)]
[im 1/36]
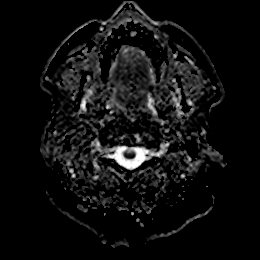
[im 18/36]
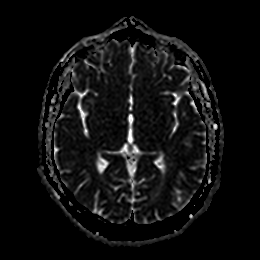
[im 36/36]
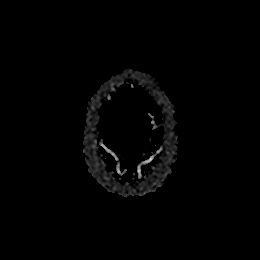

[Series 7: DWI · coronal · 5.0mm · 0.88mm/px · 2 of 28 slices shown (4 of 6)]
[im 1/28]
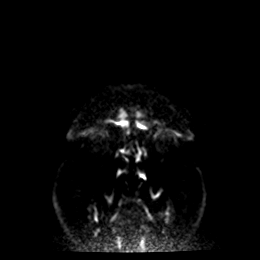
[im 28/28]
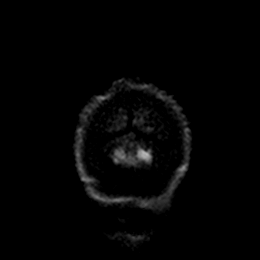

[Series 7: DWI · coronal · 5.0mm · 0.88mm/px · 2 of 28 slices shown (5 of 6)]
[im 1/28]
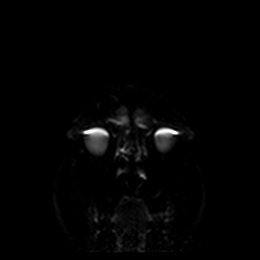
[im 28/28]
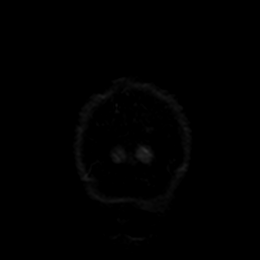

[Series 8: DWI · coronal · 5.0mm · 0.88mm/px · 2 of 28 slices shown (6 of 6)]
[im 1/28]
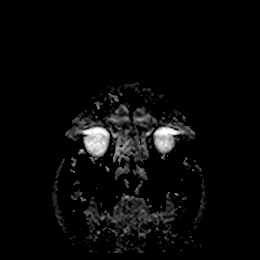
[im 28/28]
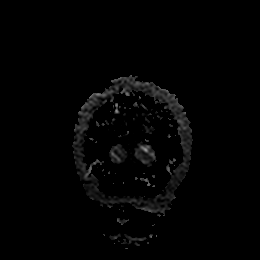

[Series 9: T1 · sagittal · 5.0mm · 0.75mm/px · 2 of 19 slices shown]
[im 1/19]
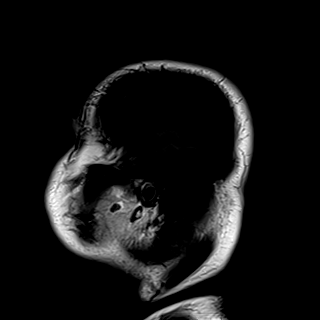
[im 19/19]
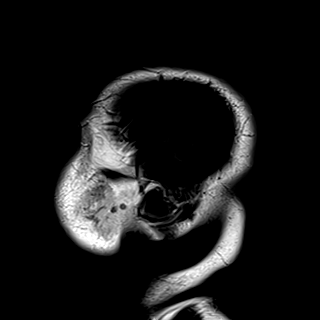

[Series 10: T2 · axial · 5.0mm · 0.72mm/px · z∈[-70,+61]mm · 2 of 20 slices shown (1 of 2)]
[im 1/20]
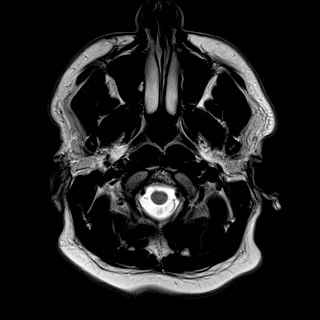
[im 20/20]
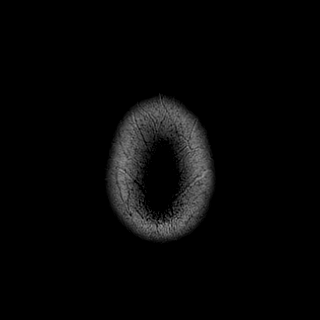

[Series 11: mag_images · axial · 3.0mm · 0.90mm/px · z∈[-92,+83]mm · 5 of 60 slices shown]
[im 1/60]
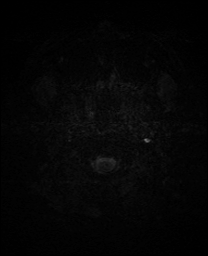
[im 15/60]
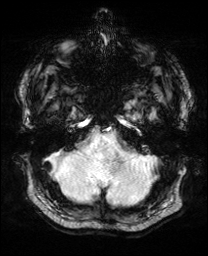
[im 30/60]
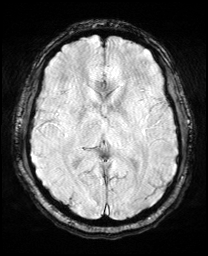
[im 45/60]
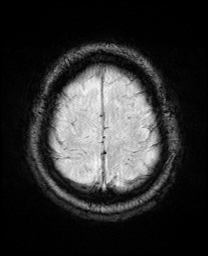
[im 60/60]
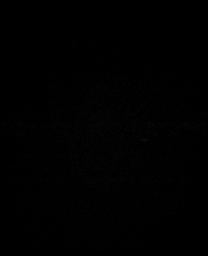

[Series 12: pha_images · axial · 3.0mm · 0.90mm/px · z∈[-92,+74]mm · 5 of 57 slices shown]
[im 1/57]
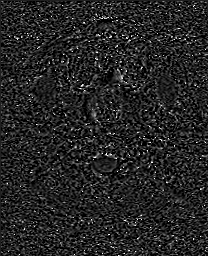
[im 15/57]
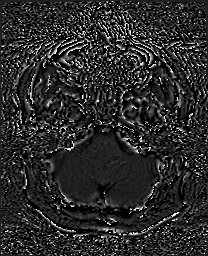
[im 29/57]
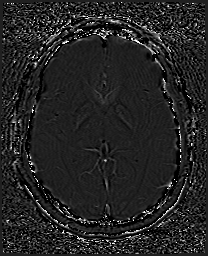
[im 43/57]
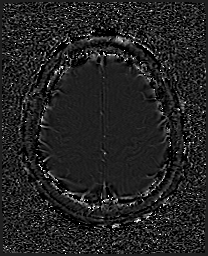
[im 57/57]
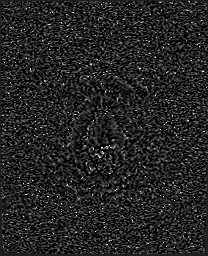

[Series 13: swi_images · axial · 3.0mm · 0.90mm/px · z∈[-92,+83]mm · 5 of 60 slices shown]
[im 1/60]
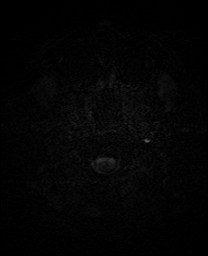
[im 15/60]
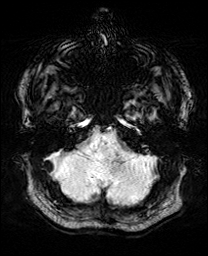
[im 30/60]
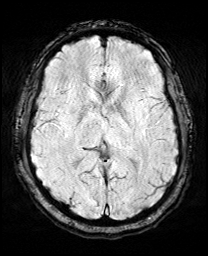
[im 45/60]
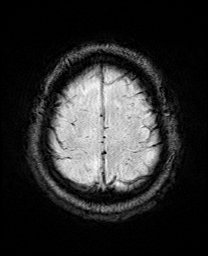
[im 60/60]
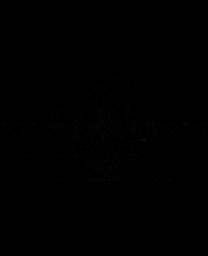

[Series 15: FLAIR · axial · 4.0mm · 0.43mm/px · z∈[-67,+56]mm · 3 of 32 slices shown (1 of 2)]
[im 1/32]
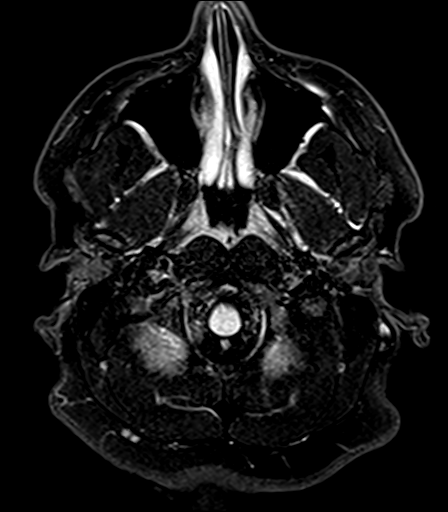
[im 16/32]
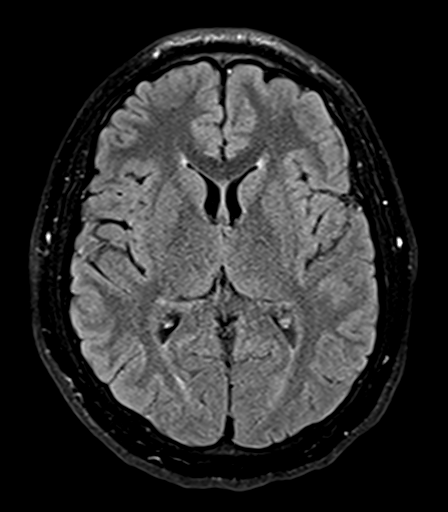
[im 32/32]
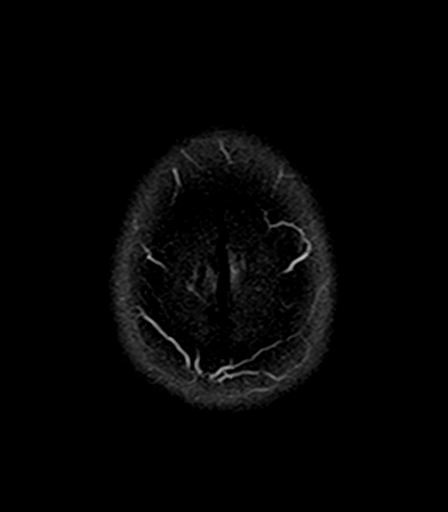

[Series 17: T2 · coronal · 5.0mm · 0.72mm/px · 2 of 28 slices shown (2 of 2)]
[im 1/28]
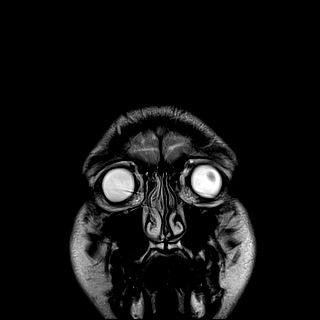
[im 28/28]
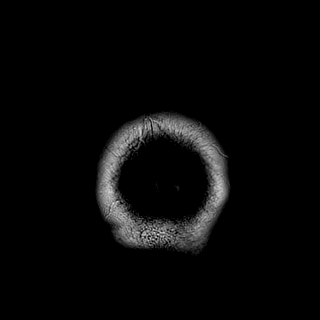

[Series 18: FLAIR · sagittal · 5.0mm · 0.94mm/px · 2 of 21 slices shown (2 of 2)]
[im 1/21]
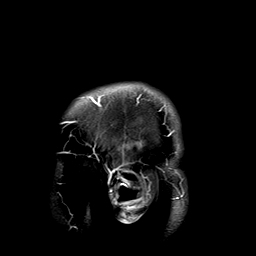
[im 21/21]
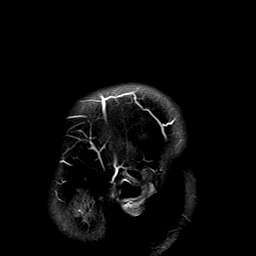

[43 of 48 positions shown; findings below may reference images not displayed]

FINDINGS: Brain: No acute infarction, hemorrhage, hydrocephalus, extra-axial
collection or mass lesion. The brain parenchyma has normal
morphology and signal characteristics.

Vascular: Normal flow voids.

Skull and upper cervical spine: Normal marrow signal.

Sinuses/Orbits: Negative.
IMPRESSION: Normal MRI of the brain.

## 2022-10-10 ENCOUNTER — Encounter (HOSPITAL_COMMUNITY): Payer: Self-pay | Admitting: Emergency Medicine

## 2022-10-10 ENCOUNTER — Emergency Department (HOSPITAL_COMMUNITY)
Admission: EM | Admit: 2022-10-10 | Discharge: 2022-10-10 | Disposition: A | Discharge status: Home / Self Care | Payer: Managed Care, Other (non HMO) | Attending: Emergency Medicine | Admitting: Emergency Medicine

## 2022-10-10 DIAGNOSIS — K529 Noninfective gastroenteritis and colitis, unspecified: Secondary | ICD-10-CM | POA: Diagnosis not present

## 2022-10-10 DIAGNOSIS — R1084 Generalized abdominal pain: Secondary | ICD-10-CM | POA: Diagnosis present

## 2022-10-10 LAB — CBC WITH DIFFERENTIAL/PLATELET
Abs Immature Granulocytes: 0.04 10*3/uL (ref 0.00–0.07)
Basophils Absolute: 0.1 10*3/uL (ref 0.0–0.1)
Basophils Relative: 1 %
Eosinophils Absolute: 0.5 10*3/uL (ref 0.0–0.5)
Eosinophils Relative: 5 %
HCT: 42.9 % (ref 39.0–52.0)
Hemoglobin: 14.5 g/dL (ref 13.0–17.0)
Immature Granulocytes: 0 %
Lymphocytes Relative: 31 %
Lymphs Abs: 3.1 10*3/uL (ref 0.7–4.0)
MCH: 29.3 pg (ref 26.0–34.0)
MCHC: 33.8 g/dL (ref 30.0–36.0)
MCV: 86.7 fL (ref 80.0–100.0)
Monocytes Absolute: 0.7 10*3/uL (ref 0.1–1.0)
Monocytes Relative: 7 %
Neutro Abs: 5.8 10*3/uL (ref 1.7–7.7)
Neutrophils Relative %: 56 %
Platelets: 350 10*3/uL (ref 150–400)
RBC: 4.95 MIL/uL (ref 4.22–5.81)
RDW: 13.2 % (ref 11.5–15.5)
WBC: 10.2 10*3/uL (ref 4.0–10.5)
nRBC: 0 % (ref 0.0–0.2)

## 2022-10-10 LAB — URINALYSIS, ROUTINE W REFLEX MICROSCOPIC
Bacteria, UA: NONE SEEN
Bilirubin Urine: NEGATIVE
Glucose, UA: NEGATIVE mg/dL
Hgb urine dipstick: NEGATIVE
Ketones, ur: NEGATIVE mg/dL
Nitrite: NEGATIVE
Protein, ur: NEGATIVE mg/dL
Specific Gravity, Urine: 1.017 (ref 1.005–1.030)
pH: 5 (ref 5.0–8.0)

## 2022-10-10 LAB — COMPREHENSIVE METABOLIC PANEL
ALT: 47 U/L — ABNORMAL HIGH (ref 0–44)
AST: 24 U/L (ref 15–41)
Albumin: 3.8 g/dL (ref 3.5–5.0)
Alkaline Phosphatase: 83 U/L (ref 38–126)
Anion gap: 6 (ref 5–15)
BUN: 11 mg/dL (ref 6–20)
CO2: 25 mmol/L (ref 22–32)
Calcium: 8.3 mg/dL — ABNORMAL LOW (ref 8.9–10.3)
Chloride: 105 mmol/L (ref 98–111)
Creatinine, Ser: 0.76 mg/dL (ref 0.61–1.24)
GFR, Estimated: >60 mL/min (ref >60)
Glucose, Bld: 116 mg/dL — ABNORMAL HIGH (ref 70–99)
Potassium: 3.5 mmol/L (ref 3.5–5.1)
Sodium: 136 mmol/L (ref 135–145)
Total Bilirubin: 0.5 mg/dL (ref 0.3–1.2)
Total Protein: 7.3 g/dL (ref 6.5–8.1)

## 2022-10-10 LAB — LIPASE, BLOOD: Lipase: 31 U/L (ref 11–51)

## 2022-10-10 MED ORDER — PROCHLORPERAZINE EDISYLATE 10 MG/2ML IJ SOLN
10.0000 mg | Freq: Once | INTRAMUSCULAR | Status: AC
  Administered 2022-10-10: 10 mg via INTRAVENOUS
  Filled 2022-10-10: qty 2

## 2022-10-10 MED ORDER — ONDANSETRON 4 MG PO TBDP: ORAL_TABLET | ORAL | 0 refills | Status: DC

## 2022-10-10 MED ORDER — LOPERAMIDE HCL 2 MG PO CAPS
4.0000 mg | ORAL_CAPSULE | Freq: Once | ORAL | Status: AC
  Administered 2022-10-10: 4 mg via ORAL
  Filled 2022-10-10: qty 2

## 2022-10-10 MED ORDER — ALUM & MAG HYDROXIDE-SIMETH 200-200-20 MG/5ML PO SUSP
15.0000 mL | Freq: Once | ORAL | Status: AC
  Administered 2022-10-10: 15 mL via ORAL
  Filled 2022-10-10: qty 30

## 2022-10-10 MED ORDER — DROPERIDOL 2.5 MG/ML IJ SOLN: 1.2500 mg | Freq: Once | INTRAMUSCULAR | Status: DC

## 2022-10-10 MED ORDER — DICYCLOMINE HCL 10 MG PO CAPS
10.0000 mg | ORAL_CAPSULE | Freq: Once | ORAL | Status: AC
  Administered 2022-10-10: 10 mg via ORAL
  Filled 2022-10-10: qty 1

## 2022-10-10 MED ORDER — LACTATED RINGERS IV BOLUS
1000.0000 mL | Freq: Once | INTRAVENOUS | Status: AC
  Administered 2022-10-10: 1000 mL via INTRAVENOUS

## 2022-10-10 NOTE — ED Provider Notes (Signed)
[facility] EMERGENCY DEPARTMENT Provider Note   CSN: [id number] Arrival date & time: 10/10/22  7824     History  Chief Complaint  Patient presents with   Abdominal Pain    Richard Raymond is a 21 y.o. adult.   Abdominal Pain Pain location:  Generalized Pain quality: aching and cramping   Pain radiates to:  Does not radiate Pain severity:  Mild Timing:  Constant Context: not alcohol use   Relieved by:  None tried Worsened by:  Nothing Ineffective treatments:  None tried Associated symptoms: nausea   Associated symptoms: no melena        Home Medications Prior to Admission medications   Medication Sig Start Date End Date Taking? Authorizing Provider  albuterol (VENTOLIN HFA) 108 (90 Base) MCG/ACT inhaler Inhale 2 puffs into the lungs every 6 (six) hours as needed for wheezing or shortness of breath. 12/13/20   Novella Olive, FNP  FLUoxetine (PROZAC) 10 MG capsule TAKE 3 CAPSULES BY MOUTH DAILY. 02/12/21   [name], MD  hydrOXYzine (ATARAX/VISTARIL) 25 MG tablet Take 1 tablet (25 mg total) by mouth at bedtime as needed for anxiety (sleep). 01/26/21   [name], MD  MECLIZINE HCL PO Take by mouth.    [provider]      Allergies    Cefprozil    Review of Systems   Review of Systems  Gastrointestinal:  Positive for abdominal pain and nausea. Negative for melena.    Physical Exam Updated Vital Signs BP 124/85 (BP Location: Right Arm)   Pulse 94   Temp 98.6 F (37 C) (Oral)   Resp 19   SpO2 99%  Physical Exam Vitals and nursing note reviewed.  Constitutional:      Appearance: She is well-developed.  HENT:     Head: Normocephalic and atraumatic.  Cardiovascular:     Rate and Rhythm: Normal rate.  Pulmonary:     Effort: Pulmonary effort is normal. No respiratory distress.  Abdominal:     General: There is no distension.     Tenderness: There is no abdominal tenderness.  Musculoskeletal:        General: Normal range of motion.      Cervical back: Normal range of motion.  Skin:    General: Skin is warm and dry.  Neurological:     General: No focal deficit present.     Mental Status: She is alert.     ED Results / Procedures / Treatments   Labs (all labs ordered are listed, but only abnormal results are displayed) Labs Reviewed  CBC WITH DIFFERENTIAL/PLATELET  COMPREHENSIVE METABOLIC PANEL  LIPASE, BLOOD  URINALYSIS, ROUTINE W REFLEX MICROSCOPIC    EKG None  Radiology No results found.  Procedures Procedures    Medications Ordered in ED Medications  droperidol (INAPSINE) 2.5 MG/ML injection 1.25 mg (has no administration in time range)  loperamide (IMODIUM) capsule 4 mg (has no administration in time range)  dicyclomine (BENTYL) capsule 10 mg (has no administration in time range)  lactated ringers bolus 1,000 mL (has no administration in time range)    ED Course/ Medical Decision Making/ A&P                           Medical Decision Making Amount and/or Complexity of Data Reviewed Labs: ordered.  Risk OTC drugs. Prescription drug management.   Difuse abdominal cramping and nausea for a couple days, seems to be worsening. No  fever. No diarrhea/constipation noted. No focal ttp or e/o peritonitis to require imaging. Will initiate symptomatic treatmetn for the time being and reassess for further workup/disposition.   Care transferred to Dr. Roderic Palau pending labs and reeval of symptoms for ultimate disposition.    Final Clinical Impression(s) / ED Diagnoses Final diagnoses:  None    Rx / DC Orders ED Discharge Orders     None         Odaly Peri, Corene Cornea, MD 10/16/22 [phone]

## 2022-10-10 NOTE — ED Notes (Signed)
Pt given crackers and a sprite per MD approval.

## 2022-10-10 NOTE — ED Triage Notes (Signed)
Pt c/o abdominal migraine since yesterday that ended about midnight. Pt states unable to keep any food down since. Pt also c/o severe gas since this started.

## 2022-10-10 NOTE — Discharge Instructions (Signed)
Drink plenty of fluids and follow-up with your doctor if not improving °

## 2022-10-15 NOTE — ED Provider Notes (Signed)
[facility] EMERGENCY DEPARTMENT Provider Note   CSN: [id number] Arrival date & time: 10/10/22  1601     History  Chief Complaint  Patient presents with   Abdominal Pain    Richard Raymond is a 21 y.o. adult.  Patient complains of abdominal cramping and vomiting.  Patient has a history of abdominal problems  The history is provided by the patient and medical records. No language interpreter was used.  Abdominal Pain Pain location:  Generalized Pain quality: aching   Pain radiates to:  Does not radiate Pain severity:  Moderate Onset quality:  Sudden Timing:  Constant Progression:  Worsening Chronicity:  New Context: not alcohol use   Relieved by:  Nothing Worsened by:  Nothing Associated symptoms: nausea   Associated symptoms: no chest pain, no cough, no diarrhea, no fatigue and no hematuria        Home Medications Prior to Admission medications   Medication Sig Start Date End Date Taking? Authorizing Provider  ondansetron (ZOFRAN-ODT) 4 MG disintegrating tablet 4mg  ODT q4 hours prn nausea/vomit 10/10/22  Yes 10/12/22, MD  albuterol (VENTOLIN HFA) 108 (90 Base) MCG/ACT inhaler Inhale 2 puffs into the lungs every 6 (six) hours as needed for wheezing or shortness of breath. 12/13/20   12/15/20, FNP  FLUoxetine (PROZAC) 10 MG capsule TAKE 3 CAPSULES BY MOUTH DAILY. 02/12/21   02/14/21, MD  hydrOXYzine (ATARAX/VISTARIL) 25 MG tablet Take 1 tablet (25 mg total) by mouth at bedtime as needed for anxiety (sleep). 01/26/21   03/28/21, MD  MECLIZINE HCL PO Take by mouth.    [provider]      Allergies    Cefprozil    Review of Systems   Review of Systems  Constitutional:  Negative for appetite change and fatigue.  HENT:  Negative for congestion, ear discharge and sinus pressure.   Eyes:  Negative for discharge.  Respiratory:  Negative for cough.   Cardiovascular:  Negative for chest pain.  Gastrointestinal:  Positive for abdominal pain  and nausea. Negative for diarrhea.  Genitourinary:  Negative for frequency and hematuria.  Musculoskeletal:  Negative for back pain.  Skin:  Negative for rash.  Neurological:  Negative for seizures and headaches.  Psychiatric/Behavioral:  Negative for hallucinations.     Physical Exam Updated Vital Signs BP 125/73   Pulse 84   Temp 98.9 F (37.2 C)   Resp 18   SpO2 97%  Physical Exam Vitals and nursing note reviewed.  Constitutional:      Appearance: She is well-developed.  HENT:     Head: Normocephalic.     Nose: Nose normal.  Eyes:     General: No scleral icterus.    Conjunctiva/sclera: Conjunctivae normal.  Neck:     Thyroid: No thyromegaly.  Cardiovascular:     Rate and Rhythm: Normal rate and regular rhythm.     Heart sounds: No murmur heard.    No friction rub. No gallop.  Pulmonary:     Breath sounds: No stridor. No wheezing or rales.  Chest:     Chest wall: No tenderness.  Abdominal:     General: There is no distension.     Tenderness: There is no abdominal tenderness. There is no rebound.  Musculoskeletal:        General: Normal range of motion.     Cervical back: Neck supple.  Lymphadenopathy:     Cervical: No cervical adenopathy.  Skin:    Findings: No erythema  or rash.  Neurological:     Mental Status: She is alert and oriented to person, place, and time.     Motor: No abnormal muscle tone.     Coordination: Coordination normal.  Psychiatric:        Behavior: Behavior normal.     ED Results / Procedures / Treatments   Labs (all labs ordered are listed, but only abnormal results are displayed) Labs Reviewed  COMPREHENSIVE METABOLIC PANEL - Abnormal; Notable for the following components:      Result Value   Glucose, Bld 116 (*)    Calcium 8.3 (*)    ALT 47 (*)    All other components within normal limits  URINALYSIS, ROUTINE W REFLEX MICROSCOPIC - Abnormal; Notable for the following components:   Leukocytes,Ua TRACE (*)    All other  components within normal limits  CBC WITH DIFFERENTIAL/PLATELET  LIPASE, BLOOD    EKG None  Radiology No results found.  Procedures Procedures    Medications Ordered in ED Medications  loperamide (IMODIUM) capsule 4 mg (4 mg Oral Given 10/10/22 0540)  dicyclomine (BENTYL) capsule 10 mg (10 mg Oral Given 10/10/22 0540)  lactated ringers bolus 1,000 mL (0 mLs Intravenous Stopped 10/10/22 0745)  prochlorperazine (COMPAZINE) injection 10 mg (10 mg Intravenous Given 10/10/22 0540)  alum & mag hydroxide-simeth (MAALOX/MYLANTA) 200-200-20 MG/5ML suspension 15 mL (15 mLs Oral Given 10/10/22 0746)    ED Course/ Medical Decision Making/ A&P                           Medical Decision Making Amount and/or Complexity of Data Reviewed Labs: ordered.  Risk OTC drugs. Prescription drug management.  This patient presents to the ED for concern of abdominal pain nausea diarrhea, this involves an extensive number of treatment options, and is a complaint that carries with it a high risk of complications and morbidity.  The differential diagnosis includes cellulitis or appendicitis   Co morbidities that complicate the patient evaluation  Chronic abdominal discomfort   Additional history obtained:  Additional history obtained from patient External records from outside source obtained and reviewed including hospital records   Lab Tests:  I Ordered, and personally interpreted labs.  The pertinent results include: Chemistries normal   Imaging Studies ordered:  No imaging  Cardiac Monitoring: / EKG:  The patient was maintained on a cardiac monitor.  I personally viewed and interpreted the cardiac monitored which showed an underlying rhythm of: Normal sinus rhythm   Consultations Obtained:  No consultant  Problem List / ED Course / Critical interventions / Medication management  Abdominal pain and vomiting I ordered medication including normal saline and  Compazine Reevaluation of the patient after these medicines showed that the patient improved I have reviewed the patients home medicines and have made adjustments as needed   Social Determinants of Health:  None   Test / Admission - Considered:  CT scan considered of abdomen  Patient with abdominal pain and nausea.  He improved with IV fluids and Compazine.  He will follow-up with his PCP        Final Clinical Impression(s) / ED Diagnoses Final diagnoses:  Gastroenteritis    Rx / DC Orders ED Discharge Orders          Ordered    ondansetron (ZOFRAN-ODT) 4 MG disintegrating tablet        10/10/22 0903  Bethann Berkshire, MD 10/15/22 1245

## 2022-12-19 ENCOUNTER — Emergency Department (HOSPITAL_COMMUNITY)
Admission: EM | Admit: 2022-12-19 | Discharge: 2022-12-19 | Disposition: A | Discharge status: Home / Self Care | Payer: BC Managed Care – PPO | Attending: Emergency Medicine | Admitting: Emergency Medicine

## 2022-12-19 ENCOUNTER — Emergency Department (HOSPITAL_COMMUNITY): Payer: BC Managed Care – PPO

## 2022-12-19 ENCOUNTER — Other Ambulatory Visit: Payer: Self-pay

## 2022-12-19 ENCOUNTER — Encounter (HOSPITAL_COMMUNITY): Payer: Self-pay | Admitting: Emergency Medicine

## 2022-12-19 DIAGNOSIS — N201 Calculus of ureter: Secondary | ICD-10-CM | POA: Insufficient documentation

## 2022-12-19 DIAGNOSIS — N134 Hydroureter: Secondary | ICD-10-CM | POA: Diagnosis not present

## 2022-12-19 DIAGNOSIS — Z20822 Contact with and (suspected) exposure to covid-19: Secondary | ICD-10-CM | POA: Diagnosis not present

## 2022-12-19 DIAGNOSIS — K760 Fatty (change of) liver, not elsewhere classified: Secondary | ICD-10-CM | POA: Diagnosis not present

## 2022-12-19 DIAGNOSIS — R1031 Right lower quadrant pain: Secondary | ICD-10-CM | POA: Diagnosis not present

## 2022-12-19 DIAGNOSIS — R9431 Abnormal electrocardiogram [ECG] [EKG]: Secondary | ICD-10-CM | POA: Diagnosis not present

## 2022-12-19 LAB — COMPREHENSIVE METABOLIC PANEL
ALT: 68 U/L — ABNORMAL HIGH (ref 0–44)
AST: 40 U/L (ref 15–41)
Albumin: 4.4 g/dL (ref 3.5–5.0)
Alkaline Phosphatase: 81 U/L (ref 38–126)
Anion gap: 14 (ref 5–15)
BUN: 14 mg/dL (ref 6–20)
CO2: 18 mmol/L — ABNORMAL LOW (ref 22–32)
Calcium: 9.3 mg/dL (ref 8.9–10.3)
Chloride: 104 mmol/L (ref 98–111)
Creatinine, Ser: 0.94 mg/dL (ref 0.61–1.24)
GFR, Estimated: >60 mL/min (ref >60)
Glucose, Bld: 137 mg/dL — ABNORMAL HIGH (ref 70–99)
Potassium: 3.4 mmol/L — ABNORMAL LOW (ref 3.5–5.1)
Sodium: 136 mmol/L (ref 135–145)
Total Bilirubin: 0.6 mg/dL (ref 0.3–1.2)
Total Protein: 8.4 g/dL — ABNORMAL HIGH (ref 6.5–8.1)

## 2022-12-19 LAB — URINALYSIS, ROUTINE W REFLEX MICROSCOPIC
Bilirubin Urine: NEGATIVE
Glucose, UA: NEGATIVE mg/dL
Ketones, ur: 5 mg/dL — AB
Leukocytes,Ua: NEGATIVE
Nitrite: NEGATIVE
Protein, ur: 30 mg/dL — AB
RBC / HPF: >50 RBC/hpf — ABNORMAL HIGH (ref 0–5)
Specific Gravity, Urine: 1.021 (ref 1.005–1.030)
pH: 6 (ref 5.0–8.0)

## 2022-12-19 LAB — CBC WITH DIFFERENTIAL/PLATELET
Abs Immature Granulocytes: 0.06 10*3/uL (ref 0.00–0.07)
Basophils Absolute: 0.1 10*3/uL (ref 0.0–0.1)
Basophils Relative: 0 %
Eosinophils Absolute: 0.1 10*3/uL (ref 0.0–0.5)
Eosinophils Relative: 1 %
HCT: 45.1 % (ref 39.0–52.0)
Hemoglobin: 15.7 g/dL (ref 13.0–17.0)
Immature Granulocytes: 1 %
Lymphocytes Relative: 18 %
Lymphs Abs: 2.3 10*3/uL (ref 0.7–4.0)
MCH: 29.2 pg (ref 26.0–34.0)
MCHC: 34.8 g/dL (ref 30.0–36.0)
MCV: 83.8 fL (ref 80.0–100.0)
Monocytes Absolute: 0.7 10*3/uL (ref 0.1–1.0)
Monocytes Relative: 6 %
Neutro Abs: 9.4 10*3/uL — ABNORMAL HIGH (ref 1.7–7.7)
Neutrophils Relative %: 74 %
Platelets: 396 10*3/uL (ref 150–400)
RBC: 5.38 MIL/uL (ref 4.22–5.81)
RDW: 12.4 % (ref 11.5–15.5)
WBC: 12.5 10*3/uL — ABNORMAL HIGH (ref 4.0–10.5)
nRBC: 0 % (ref 0.0–0.2)

## 2022-12-19 LAB — RESP PANEL BY RT-PCR (RSV, FLU A&B, COVID)  RVPGX2
Influenza A by PCR: NEGATIVE
Influenza B by PCR: NEGATIVE
Resp Syncytial Virus by PCR: NEGATIVE
SARS Coronavirus 2 by RT PCR: NEGATIVE

## 2022-12-19 LAB — LIPASE, BLOOD: Lipase: 29 U/L (ref 11–51)

## 2022-12-19 LAB — RAPID URINE DRUG SCREEN, HOSP PERFORMED
Amphetamines: NOT DETECTED
Barbiturates: NOT DETECTED
Benzodiazepines: NOT DETECTED
Cocaine: NOT DETECTED
Opiates: NOT DETECTED
Tetrahydrocannabinol: NOT DETECTED

## 2022-12-19 MED ORDER — KETOROLAC TROMETHAMINE 30 MG/ML IJ SOLN
30.0000 mg | Freq: Once | INTRAMUSCULAR | Status: AC
  Administered 2022-12-19: 30 mg via INTRAVENOUS
  Filled 2022-12-19: qty 1

## 2022-12-19 MED ORDER — IOHEXOL 300 MG/ML  SOLN
100.0000 mL | Freq: Once | INTRAMUSCULAR | Status: AC | PRN
  Administered 2022-12-19: 100 mL via INTRAVENOUS

## 2022-12-19 MED ORDER — DROPERIDOL 2.5 MG/ML IJ SOLN
1.2500 mg | Freq: Once | INTRAMUSCULAR | Status: AC
  Administered 2022-12-19: 1.25 mg via INTRAVENOUS
  Filled 2022-12-19: qty 2

## 2022-12-19 MED ORDER — HYDROCODONE-ACETAMINOPHEN 5-325 MG PO TABS: 1.0000 | ORAL_TABLET | Freq: Four times a day (QID) | ORAL | 0 refills | Status: DC | PRN

## 2022-12-19 MED ORDER — ONDANSETRON HCL 4 MG/2ML IJ SOLN
4.0000 mg | Freq: Once | INTRAMUSCULAR | Status: AC
  Administered 2022-12-19: 4 mg via INTRAVENOUS
  Filled 2022-12-19: qty 2

## 2022-12-19 MED ORDER — LACTATED RINGERS IV BOLUS
1000.0000 mL | Freq: Once | INTRAVENOUS | Status: AC
  Administered 2022-12-19: 1000 mL via INTRAVENOUS

## 2022-12-19 MED ORDER — ONDANSETRON 4 MG PO TBDP: 4.0000 mg | ORAL_TABLET | Freq: Three times a day (TID) | ORAL | 0 refills | Status: DC | PRN

## 2022-12-19 NOTE — ED Notes (Signed)
Patient transported to CT 

## 2022-12-19 NOTE — ED Provider Notes (Signed)
[place]  Provider Note  CSN: [id number] Arrival date & time: 12/19/22 0155  History Chief Complaint  Patient presents with   Emesis    Richard Raymond is a 22 y.o. adult (biological male, gender identity male) brought by mother for several hours of nausea, vomiting, no bilious or bloody emesis and diffuse abdominal pain, points to RLQ as most severe area. They have history of similar previously diagnosed with abdominal migraines and cyclic vomiting syndrome. Patient denies any fever, no diarrhea. Denies EtOH, or drug use, specifically no THC.    Home Medications Prior to Admission medications   Medication Sig Start Date End Date Taking? Authorizing Provider  HYDROcodone-acetaminophen (NORCO/VICODIN) 5-325 MG tablet Take 1 tablet by mouth every 6 (six) hours as needed for severe pain. 12/19/22  Yes Truddie Hidden, MD  ondansetron (ZOFRAN-ODT) 4 MG disintegrating tablet Take 1 tablet (4 mg total) by mouth every 8 (eight) hours as needed for nausea or vomiting. 12/19/22  Yes Truddie Hidden, MD  albuterol (VENTOLIN HFA) 108 (90 Base) MCG/ACT inhaler Inhale 2 puffs into the lungs every 6 (six) hours as needed for wheezing or shortness of breath. 12/13/20   Chalmers Guest, FNP  FLUoxetine (PROZAC) 10 MG capsule TAKE 3 CAPSULES BY MOUTH DAILY. 02/12/21   Orlene Erm, MD  hydrOXYzine (ATARAX/VISTARIL) 25 MG tablet Take 1 tablet (25 mg total) by mouth at bedtime as needed for anxiety (sleep). 01/26/21   Orlene Erm, MD  MECLIZINE HCL PO Take by mouth.    [provider]     Allergies    Cefprozil   Review of Systems   Review of Systems Please see HPI for pertinent positives and negatives  Physical Exam BP 134/77   Pulse 88   Temp 98.6 F (37 C) (Oral)   Resp 15   Ht 5\' 4"  (1.626 m)   Wt 99.8 kg   SpO2 99%   BMI 37.76 kg/m   Physical Exam Vitals and nursing note reviewed.  Constitutional:      Appearance:  Normal appearance. She is diaphoretic.  HENT:     Head: Normocephalic and atraumatic.     Nose: Nose normal.     Mouth/Throat:     Mouth: Mucous membranes are moist.  Eyes:     Extraocular Movements: Extraocular movements intact.     Conjunctiva/sclera: Conjunctivae normal.  Cardiovascular:     Rate and Rhythm: Normal rate.  Pulmonary:     Effort: Pulmonary effort is normal.     Breath sounds: Normal breath sounds.  Abdominal:     General: Abdomen is flat.     Palpations: Abdomen is soft.     Tenderness: There is abdominal tenderness (diffuse). There is no guarding (no peritoneal signs).  Musculoskeletal:        General: No swelling. Normal range of motion.     Cervical back: Neck supple.  Skin:    General: Skin is warm.  Neurological:     General: No focal deficit present.     Mental Status: She is alert.  Psychiatric:        Mood and Affect: Mood normal.     ED Results / Procedures / Treatments   EKG EKG Interpretation  Date/Time:  Thursday December 19 2022 02:29:15 EST Ventricular Rate:  82 PR Interval:  142 QRS Duration: 97 QT Interval:  356 QTC Calculation: 416 R Axis:   95 Text Interpretation: Normal sinus rhythm No  significant change since last tracing Confirmed by Calvert Cantor [phone]) on 12/19/2022 2:32:45 AM  Procedures Procedures  Medications Ordered in the ED Medications  lactated ringers bolus 1,000 mL (0 mLs Intravenous Stopped 12/19/22 0441)  droperidol (INAPSINE) 2.5 MG/ML injection 1.25 mg (1.25 mg Intravenous Given 12/19/22 0231)  iohexol (OMNIPAQUE) 300 MG/ML solution 100 mL (100 mLs Intravenous Contrast Given 12/19/22 0255)  ondansetron (ZOFRAN) injection 4 mg (4 mg Intravenous Given 12/19/22 0343)  ketorolac (TORADOL) 30 MG/ML injection 30 mg (30 mg Intravenous Given 12/19/22 0438)    Initial Impression and Plan  Patient with history of CVS/abdominal migraines here for several hours of persistent vomiting.Has diffuse abdominal tenderness, but  no focal peritoneal signs. Will check labs, IVF and antiemetics. Will re-examine before deciding if imaging may be indicated.   ED Course   Clinical Course as of 12/19/22 0450  Thu Dec 19, 2022  0238 UA with blood, but no infection. Not having flank pain but consider renal colic if pain not improved with droperidol.  [CS]  0242 CBC with mild leukocytosis, will send for CT to ensure no infectious source of pain/vomiting such as appendicitis, cholecystitis, etc.  [CS]  0243 CMP and lipase are unremarkable.  [CS]  5643 UDS is neg.  [CS]  3295 Covid/Flu/RSV swab is neg.  [CS]  1884 I personally viewed the images from radiology studies and agree with radiologist interpretation:  CT shows a small distal ureteral stone as the likely cause of his pain. Will give a dose of Toradol and reassess for dispo.  [CS]    Clinical Course User Index [CS] Truddie Hidden, MD     MDM Rules/Calculators/A&P Medical Decision Making Given presenting complaint, I considered that admission might be necessary. After review of results from ED lab and/or imaging studies, admission to the hospital is not indicated at this time.    Problems Addressed: Right ureteral stone: acute illness or injury  Amount and/or Complexity of Data Reviewed Labs: ordered. Decision-making details documented in ED Course. Radiology: ordered and independent interpretation performed. Decision-making details documented in ED Course. ECG/medicine tests: ordered and independent interpretation performed. Decision-making details documented in ED Course.  Risk Prescription drug management. Decision regarding hospitalization.     Final Clinical Impression(s) / ED Diagnoses Final diagnoses:  Right ureteral stone    Rx / DC Orders ED Discharge Orders          Ordered    HYDROcodone-acetaminophen (NORCO/VICODIN) 5-325 MG tablet  Every 6 hours PRN        12/19/22 0450    ondansetron (ZOFRAN-ODT) 4 MG disintegrating tablet  Every  8 hours PRN        12/19/22 0450             Truddie Hidden, MD 12/19/22 [phone]

## 2022-12-19 NOTE — ED Triage Notes (Signed)
Pt from home. Started throwing up since 2100. Thrown up 4 times. C/o pain RLQ, diarrhea. Denies fever.

## 2022-12-20 ENCOUNTER — Telehealth: Payer: Self-pay

## 2022-12-20 NOTE — Telephone Encounter (Signed)
Transition Care Management Follow-up Telephone Call Date of discharge and from where: [facility] 12/19/2022 How have you been since you were released from the hospital? Still having pain Any questions or concerns? No  Items Reviewed: Did the pt receive and understand the discharge instructions provided? Yes  Medications obtained and verified? Yes  Other? No  Any new allergies since your discharge? No  Dietary orders reviewed? Yes Do you have support at home? Yes   Home Care and Equipment/Supplies: Were home health services ordered? no If so, what is the name of the agency? N/a  Has the agency set up a time to come to the patient's home? not applicable Were any new equipment or medical supplies ordered?  No What is the name of the medical supply agency? N/a Were you able to get the supplies/equipment? not applicable Do you have any questions related to the use of the equipment or supplies? No  Functional Questionnaire: (I = Independent and D = Dependent) ADLs: I  Bathing/Dressing- I  Meal Prep- I  Eating- I  Maintaining continence- I  Transferring/Ambulation- I  Managing Meds- I  Follow up appointments reviewed:  PCP Hospital f/u appt confirmed? No  no avail appt times, sent message to staff to schedule Specialist Hospital f/u appt confirmed? No  Patient will schedule Are transportation arrangements needed? No  If their condition worsens, is the pt aware to call PCP or go to the Emergency Dept.? Yes Was the patient provided with contact information for the PCP's office or ED? Yes Was to pt encouraged to call back with questions or concerns? Yes Juanda Crumble, LPN [facility] [phone]

## 2023-01-15 DIAGNOSIS — F649 Gender identity disorder, unspecified: Secondary | ICD-10-CM | POA: Diagnosis not present

## 2023-01-15 DIAGNOSIS — E349 Endocrine disorder, unspecified: Secondary | ICD-10-CM | POA: Diagnosis not present

## 2023-01-16 DIAGNOSIS — G43009 Migraine without aura, not intractable, without status migrainosus: Secondary | ICD-10-CM | POA: Diagnosis not present

## 2023-05-15 ENCOUNTER — Ambulatory Visit (INDEPENDENT_AMBULATORY_CARE_PROVIDER_SITE_OTHER): Payer: BC Managed Care – PPO | Admitting: Family Medicine

## 2023-05-15 VITALS — BP 113/78 | HR 92 | Wt 218.6 lb

## 2023-05-15 DIAGNOSIS — I889 Nonspecific lymphadenitis, unspecified: Secondary | ICD-10-CM | POA: Diagnosis not present

## 2023-05-15 MED ORDER — AMOXICILLIN-POT CLAVULANATE 875-125 MG PO TABS: 1.0000 | ORAL_TABLET | Freq: Two times a day (BID) | ORAL | 0 refills | Status: DC

## 2023-05-15 NOTE — Progress Notes (Signed)
   Subjective:    Patient ID: Richard Raymond, adult    DOB: Feb 14, 2001, 21 y.o.   MRN: [id number]  HPI Patient arrives today with right jaw and swollen gland pain.  Patient with right ear pain discomfort hurts with opening his mouth chewing also hurts when he moves his head a certain way and feels some swelling underneath the jaw denies head congestion drainage coughing sneezing high fever chills   Review of Systems     Objective:   Physical Exam  Tenderness in the anterior cervical lymph nodes on the right side neck supple otherwise throat is normal eardrums normal      Assessment & Plan:  Cervical lymphadenitis more than likely related to the rash and broken skin behind his right ear recommend antibiotics if ongoing troubles follow-up

## 2023-12-25 ENCOUNTER — Ambulatory Visit
Admission: EM | Admit: 2023-12-25 | Discharge: 2023-12-25 | Disposition: A | Discharge status: Home / Self Care | Payer: BC Managed Care – PPO

## 2023-12-25 ENCOUNTER — Encounter: Payer: Self-pay | Admitting: *Deleted

## 2023-12-25 ENCOUNTER — Encounter (HOSPITAL_COMMUNITY): Payer: Self-pay

## 2023-12-25 ENCOUNTER — Emergency Department (HOSPITAL_COMMUNITY)
Admission: EM | Admit: 2023-12-25 | Discharge: 2023-12-25 | Disposition: A | Discharge status: Home / Self Care | Payer: BC Managed Care – PPO | Attending: Emergency Medicine | Admitting: Emergency Medicine

## 2023-12-25 ENCOUNTER — Other Ambulatory Visit: Payer: Self-pay

## 2023-12-25 ENCOUNTER — Emergency Department (HOSPITAL_COMMUNITY): Payer: BC Managed Care – PPO

## 2023-12-25 DIAGNOSIS — S0990XA Unspecified injury of head, initial encounter: Secondary | ICD-10-CM | POA: Diagnosis present

## 2023-12-25 DIAGNOSIS — W010XXA Fall on same level from slipping, tripping and stumbling without subsequent striking against object, initial encounter: Secondary | ICD-10-CM | POA: Insufficient documentation

## 2023-12-25 DIAGNOSIS — S0083XA Contusion of other part of head, initial encounter: Secondary | ICD-10-CM | POA: Diagnosis not present

## 2023-12-25 MED ORDER — ONDANSETRON 4 MG PO TBDP
4.0000 mg | ORAL_TABLET | Freq: Once | ORAL | Status: AC
  Administered 2023-12-25: 4 mg via ORAL
  Filled 2023-12-25: qty 1

## 2023-12-25 MED ORDER — ONDANSETRON 4 MG PO TBDP: 4.0000 mg | ORAL_TABLET | Freq: Three times a day (TID) | ORAL | 0 refills | Status: AC | PRN

## 2023-12-25 NOTE — ED Provider Notes (Signed)
[place] EMERGENCY DEPARTMENT AT [facility] Provider Note   CSN: [id number] Arrival date & time: 12/25/23  1234     History  Chief Complaint  Patient presents with   Head Injury    Richard Raymond is a 23 y.o. adult.  Patient went to stand up from the toilet and lost her balance, falling forward, hitting her head on the door. No LOC. Her mother took her to Urgent Care who advised she had an abnormal exam and should be seen in the ED. No vomiting but she reports nausea. No neck pain. No other injury.   The history is provided by the patient and a parent. No language interpreter was used.  Head Injury      Home Medications Prior to Admission medications   Medication Sig Start Date End Date Taking? Authorizing Provider  ondansetron (ZOFRAN-ODT) 4 MG disintegrating tablet Take 1 tablet (4 mg total) by mouth every 8 (eight) hours as needed for nausea or vomiting. 12/25/23  Yes Sayvion Vigen, PA-C  albuterol (VENTOLIN HFA) 108 (90 Base) MCG/ACT inhaler Inhale 2 puffs into the lungs every 6 (six) hours as needed for wheezing or shortness of breath. 12/13/20   Novella Olive, FNP  amoxicillin-clavulanate (AUGMENTIN) 875-125 MG tablet Take 1 tablet by mouth 2 (two) times daily. 05/15/23   Babs Sciara, MD  FLUoxetine (PROZAC) 10 MG capsule TAKE 3 CAPSULES BY MOUTH DAILY. 02/12/21   [name], MD  hydrOXYzine (ATARAX/VISTARIL) 25 MG tablet Take 1 tablet (25 mg total) by mouth at bedtime as needed for anxiety (sleep). 01/26/21   [name], MD  MECLIZINE HCL PO Take by mouth.    [provider]      Allergies    Levonorgestrel-ethinyl estrad, Cefprozil, and Lactose intolerance (gi)    Review of Systems   Review of Systems  Physical Exam Updated Vital Signs BP 123/78   Pulse 81   Temp 98.6 F (37 C) (Oral)   Resp 18   Ht 5\' 4"  (1.626 m)   Wt 95.3 kg   SpO2 99%   BMI 36.05 kg/m  Physical Exam Vitals and nursing note reviewed.  Constitutional:       Appearance: She is well-developed.  HENT:     Head: Normocephalic.     Nose: Nose normal.  Eyes:     Pupils: Pupils are equal, round, and reactive to light.  Cardiovascular:     Rate and Rhythm: Normal rate and regular rhythm.  Pulmonary:     Effort: Pulmonary effort is normal.  Abdominal:     General: Bowel sounds are normal.     Palpations: Abdomen is soft.     Tenderness: There is no abdominal tenderness. There is no guarding or rebound.  Musculoskeletal:        General: Normal range of motion.     Cervical back: Normal range of motion and neck supple.  Skin:    General: Skin is warm and dry.     Comments: Small hematoma to right forehead. No open wound.   Neurological:     General: No focal deficit present.     Mental Status: She is alert and oriented to person, place, and time.     GCS: GCS eye subscore is 4. GCS verbal subscore is 5. GCS motor subscore is 6.     Cranial Nerves: Cranial nerves 2-12 are intact. No dysarthria or facial asymmetry.     Sensory: Sensation is intact.  Motor: Motor function is intact.     Coordination: Coordination normal.     ED Results / Procedures / Treatments   Labs (all labs ordered are listed, but only abnormal results are displayed) Labs Reviewed - No data to display  EKG None  Radiology CT Head Wo Contrast Result Date: 12/25/2023 CLINICAL DATA:  Head trauma, nausea EXAM: CT HEAD WITHOUT CONTRAST TECHNIQUE: Contiguous axial images were obtained from the base of the skull through the vertex without intravenous contrast. RADIATION DOSE REDUCTION: This exam was performed according to the departmental dose-optimization program which includes automated exposure control, adjustment of the mA and/or kV according to patient size and/or use of iterative reconstruction technique. COMPARISON:  No prior CT head available, correlation is made with MRI head 02/01/2021 FINDINGS: Brain: No evidence of acute infarction, hemorrhage, mass, mass  effect, or midline shift. No hydrocephalus or extra-axial fluid collection. Vascular: No hyperdense vessel. Skull: Negative for fracture or focal lesion. 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 Sinuses/Orbits: No acute finding. Other: The mastoid air cells are well aerated. IMPRESSION: No acute intracranial process. Electronically Signed   By: Wiliam Ke M.D.   On: 12/25/2023 13:55    Procedures Procedures    Medications Ordered in ED Medications  ondansetron (ZOFRAN-ODT) disintegrating tablet 4 mg (4 mg Oral Given 12/25/23 1548)    ED Course/ Medical Decision Making/ A&P Clinical Course as of 12/26/23 1521  Thu Dec 25, 2023  1519 Patient to ED with minor head injury, no full syncope, forehead hematoma. CT head negative. Neurologic exam without appreciable deficit. No change in mental status for duration of ED visit. She is felt appropriate for discharge with mom to go home.  [SU]    Clinical Course User Index [SU] Elpidio Anis, PA-C                                 Medical Decision Making Risk Prescription drug management.           Final Clinical Impression(s) / ED Diagnoses Final diagnoses:  Minor head injury, initial encounter    Rx / DC Orders ED Discharge Orders          Ordered    ondansetron (ZOFRAN-ODT) 4 MG disintegrating tablet  Every 8 hours PRN        12/25/23 1548              Elpidio Anis, PA-C 12/26/23 1521    Bethann Berkshire, MD 12/28/23 1013

## 2023-12-25 NOTE — ED Triage Notes (Signed)
Pt states they fell this morning (9:50am) in the bathroom and hit head on bathroom door knob. Pt complains of blurry vision, pressure in the head, nausea. Pt denies LOC

## 2023-12-25 NOTE — Discharge Instructions (Addendum)
Take Zofran for nausea every 8 hours as needed. Tylenol for any headache.   Follow up with your doctor if symptoms persist or worsen. Return to the ED with any new concerns.

## 2023-12-25 NOTE — ED Notes (Signed)
Patient is being discharged from the Urgent Care and sent to the Emergency Department via POV . Per Roosvelt Maser, PA, patient is in need of higher level of care due to head injury. Patient is aware and verbalizes understanding of plan of care.  Vitals:   12/25/23 1215  BP: 111/73  Pulse: 75  Resp: 18  Temp: 98.4 F (36.9 C)  SpO2: 96%

## 2023-12-25 NOTE — ED Notes (Signed)
Spoke with providers and advised to go to ED since we do not have CT scan here. Mom is aware and verbalized understanding.

## 2023-12-25 NOTE — ED Triage Notes (Signed)
Pt arrived via POV from Urgent Care. Pt denies LOC, but does present with a knot on right scalp, and redness. Pt endorses nausea w/o emesis. Pupils equal and reactive.

## 2024-05-26 ENCOUNTER — Ambulatory Visit: Admission: EM | Admit: 2024-05-26 | Discharge: 2024-05-26 | Disposition: A | Discharge status: Home / Self Care

## 2024-05-26 ENCOUNTER — Encounter: Payer: Self-pay | Admitting: Emergency Medicine

## 2024-05-26 DIAGNOSIS — J392 Other diseases of pharynx: Secondary | ICD-10-CM

## 2024-05-26 LAB — POCT RAPID STREP A (OFFICE): Rapid Strep A Screen: NEGATIVE

## 2024-05-26 NOTE — Discharge Instructions (Signed)
 Your exam was reassuring today, and your throat swab was negative for strep bacteria.  I recommend warm salt water gargles, good dental hygiene, throat lozenges and throat sprays as needed.  Follow-up with dentist or primary care for recheck if not resolving.

## 2024-05-26 NOTE — ED Triage Notes (Signed)
 States has a lump in back of throat.  States thought had something stuck in throat a week ago and was poking  throat with finger and felt a lump

## 2024-05-26 NOTE — ED Provider Notes (Signed)
 [facility]    CSN: [id number] Arrival date & time: 05/26/24  1350      History   Chief Complaint No chief complaint on file.   HPI Richard Raymond is a 23 y.o. adult.   Presenting today with concern of a lump in the back of throat.  First noticed while eating soup about a week ago, felt like something was lodged in the throat so used a finger and felt something dislodge but also felt a lump in this area that continues to be irritated.  Denies difficulty breathing or swallowing, fevers, chills, congestion, cough.  So far not trying anything over-the-counter for symptoms other than Listerine.    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 Authorizing Provider  estradiol (EVAMIST) 1.53 MG/SPRAY transdermal spray Place 1 spray onto the skin daily.   Yes [provider]  albuterol  (VENTOLIN  HFA) 108 (90 Base) MCG/ACT inhaler Inhale 2 puffs into the lungs every 6 (six) hours as needed for wheezing or shortness of breath. 12/13/20   Booker Darice SAUNDERS, FNP  FLUoxetine  (PROZAC ) 10 MG capsule TAKE 3 CAPSULES BY MOUTH DAILY. 02/12/21   Umrania, Hiren M, MD  hydrOXYzine  (ATARAX /VISTARIL ) 25 MG tablet Take 1 tablet (25 mg total) by mouth at bedtime as needed for anxiety (sleep). 01/26/21   Susen Shelton HERO, MD  ondansetron  (ZOFRAN -ODT) 4 MG disintegrating tablet Take 1 tablet (4 mg total) by mouth every 8 (eight) hours as needed for nausea or  vomiting. 12/25/23   Odell Balls, PA-C    Family History Family History  Problem Relation Age of Onset   ADD / ADHD Brother     Social History Social History   Tobacco Use   Smoking status: Never    Passive exposure: Never   Smokeless tobacco: Never  Vaping Use   Vaping status: Never Used  Substance Use Topics   Alcohol use: No   Drug use: No     Allergies   Levonorgestrel-ethinyl estrad, Cefprozil, and Lactose intolerance (gi)   Review of Systems Review of Systems Per HPI  Physical Exam Triage Vital Signs ED Triage Vitals  Encounter Vitals Group     BP 05/26/24 1358 115/76     Girls Systolic BP Percentile --      Girls Diastolic BP Percentile --      Boys Systolic BP Percentile --      Boys Diastolic BP Percentile --      Pulse Rate 05/26/24 1358 84     Resp 05/26/24 1358 18     Temp 05/26/24 1358 98.6 F (37 C)     Temp Source 05/26/24 1358 Oral     SpO2 05/26/24 1358 95 %     Weight --      Height --      Head Circumference --      Peak Flow --      Pain Score  05/26/24 1359 4     Pain Loc --      Pain Education --      Exclude from Growth Chart --    No data found.  Updated Vital Signs BP 115/76 (BP Location: Right Arm)   Pulse 84   Temp 98.6 F (37 C) (Oral)   Resp 18   SpO2 95%   Visual Acuity Right Eye Distance:   Left Eye Distance:   Bilateral Distance:    Right Eye Near:   Left Eye Near:    Bilateral Near:     Physical Exam Vitals and nursing note reviewed.  Constitutional:      Appearance: Normal appearance.  HENT:     Head: Atraumatic.     Nose: Nose normal.     Mouth/Throat:     Mouth: Mucous membranes are moist.     Pharynx: Oropharynx is clear. No oropharyngeal exudate or posterior oropharyngeal erythema.     Comments: Uvula midline, oral airway patent, no obvious abnormalities on oral exam Eyes:     Extraocular Movements: Extraocular movements intact.     Conjunctiva/sclera: Conjunctivae normal.  Cardiovascular:      Rate and Rhythm: Normal rate.  Pulmonary:     Effort: Pulmonary effort is normal.  Musculoskeletal:        General: Normal range of motion.     Cervical back: Normal range of motion and neck supple.  Lymphadenopathy:     Cervical: No cervical adenopathy.  Skin:    General: Skin is warm and dry.  Neurological:     Mental Status: She is oriented to person, place, and time.  Psychiatric:        Mood and Affect: Mood normal.        Thought Content: Thought content normal.        Judgment: Judgment normal.      UC Treatments / Results  Labs (all labs ordered are listed, but only abnormal results are displayed) Labs Reviewed  POCT RAPID STREP A (OFFICE) - Normal    EKG   Radiology No results found.  Procedures Procedures (including critical care time)  Medications Ordered in UC Medications - No data to display  Initial Impression / Assessment and Plan / UC Course  I have reviewed the triage vital signs and the nursing notes.  Pertinent labs & imaging results that were available during my care of the patient were reviewed by me and considered in my medical decision making (see chart for details).     No obvious abnormalities on exam today, vitals within normal limits.  Rapid strep negative.  Discussed salt water gargles, warm honey tea, continue monitoring.  Dental or PCP follow-up if not resolving  Final Clinical Impressions(s) / UC Diagnoses   Final diagnoses:  Throat irritation     Discharge Instructions      Your exam was reassuring today, and your throat swab was negative for strep bacteria.  I recommend warm salt water gargles, good dental hygiene, throat lozenges and throat sprays as needed.  Follow-up with dentist or primary care for recheck if not resolving.     ED Prescriptions   None    PDMP not reviewed this encounter.   Stuart Vernell Norris, [place] 05/26/24 1525
